# Patient Record
Sex: Male | Born: 1980 | Race: Black or African American | Hispanic: No | Marital: Single | Smoking: Current every day smoker
Health system: Southern US, Community
[De-identification: ages and names within clinical notes are randomized; demographics above are authoritative.]

## PROBLEM LIST (undated history)

## (undated) ENCOUNTER — Emergency Department (HOSPITAL_COMMUNITY): Admission: EM | Payer: Self-pay | Source: Home / Self Care

## (undated) DIAGNOSIS — F32A Depression, unspecified: Secondary | ICD-10-CM

## (undated) DIAGNOSIS — F329 Major depressive disorder, single episode, unspecified: Secondary | ICD-10-CM

## (undated) DIAGNOSIS — F319 Bipolar disorder, unspecified: Secondary | ICD-10-CM

---
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
## 2005-07-04 ENCOUNTER — Emergency Department (HOSPITAL_COMMUNITY): Admission: EM | Admit: 2005-07-04 | Discharge: 2005-07-05 | Payer: Self-pay | Admitting: Emergency Medicine

## 2007-11-13 ENCOUNTER — Emergency Department (HOSPITAL_COMMUNITY): Admission: EM | Admit: 2007-11-13 | Discharge: 2007-11-13 | Payer: Self-pay | Admitting: Emergency Medicine

## 2007-12-10 ENCOUNTER — Emergency Department (HOSPITAL_COMMUNITY): Admission: EM | Admit: 2007-12-10 | Discharge: 2007-12-12 | Payer: Self-pay | Admitting: Emergency Medicine

## 2008-04-05 ENCOUNTER — Emergency Department (HOSPITAL_COMMUNITY): Admission: EM | Admit: 2008-04-05 | Discharge: 2008-04-05 | Payer: Self-pay | Admitting: Emergency Medicine

## 2009-06-10 ENCOUNTER — Emergency Department (HOSPITAL_COMMUNITY): Admission: EM | Admit: 2009-06-10 | Discharge: 2009-06-10 | Payer: Self-pay | Admitting: Emergency Medicine
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
## 2011-01-21 LAB — URINALYSIS, ROUTINE W REFLEX MICROSCOPIC
Bilirubin Urine: NEGATIVE
Ketones, ur: NEGATIVE
Nitrite: NEGATIVE
Protein, ur: NEGATIVE
pH: 6.5

## 2011-01-21 LAB — RAPID URINE DRUG SCREEN, HOSP PERFORMED
Cocaine: NOT DETECTED
Tetrahydrocannabinol: POSITIVE — AB

## 2011-01-21 LAB — SALICYLATE LEVEL: Salicylate Lvl: <4

## 2011-01-21 LAB — DIFFERENTIAL
Basophils Absolute: 0
Basophils Relative: 0
Eosinophils Absolute: 0
Eosinophils Relative: 1
Lymphs Abs: 1.3
Neutrophils Relative %: 73

## 2011-01-21 LAB — GLUCOSE, CAPILLARY: Glucose-Capillary: 76

## 2011-01-21 LAB — CBC
HCT: 46.1
MCHC: 33.4
MCV: 86
Platelets: 157
RDW: 13.7

## 2011-01-21 LAB — POCT I-STAT, CHEM 8
BUN: 6
Hemoglobin: 17
Potassium: 3.7
Sodium: 141
TCO2: 21

## 2011-01-28 LAB — DIFFERENTIAL
Eosinophils Relative: 3 % (ref 0–5)
Lymphocytes Relative: 51 % — ABNORMAL HIGH (ref 12–46)
Lymphs Abs: 3.3 10*3/uL (ref 0.7–4.0)
Monocytes Absolute: 0.6 10*3/uL (ref 0.1–1.0)
Monocytes Relative: 10 % (ref 3–12)

## 2011-01-28 LAB — POCT I-STAT, CHEM 8
BUN: 7 mg/dL (ref 6–23)
Calcium, Ion: 1.17 mmol/L (ref 1.12–1.32)
Creatinine, Ser: 1.1 mg/dL (ref 0.4–1.5)
Glucose, Bld: 95 mg/dL (ref 70–99)
TCO2: 26 mmol/L (ref 0–100)

## 2011-01-28 LAB — URINALYSIS, ROUTINE W REFLEX MICROSCOPIC
Ketones, ur: NEGATIVE mg/dL
Nitrite: NEGATIVE
Protein, ur: NEGATIVE mg/dL
pH: 6 (ref 5.0–8.0)

## 2011-01-28 LAB — CBC
HCT: 45 % (ref 39.0–52.0)
Hemoglobin: 14.8 g/dL (ref 13.0–17.0)
WBC: 6.4 10*3/uL (ref 4.0–10.5)

## 2011-01-28 LAB — RAPID URINE DRUG SCREEN, HOSP PERFORMED: Barbiturates: NOT DETECTED

## 2011-01-28 LAB — ETHANOL: Alcohol, Ethyl (B): <5 mg/dL (ref 0–10)

## 2011-08-19 ENCOUNTER — Emergency Department (HOSPITAL_COMMUNITY): Payer: Medicaid Other

## 2011-08-19 ENCOUNTER — Encounter (HOSPITAL_COMMUNITY): Payer: Self-pay | Admitting: *Deleted

## 2011-08-19 ENCOUNTER — Emergency Department (HOSPITAL_COMMUNITY)
Admission: EM | Admit: 2011-08-19 | Discharge: 2011-08-19 | Disposition: A | Discharge status: Home / Self Care | Payer: Medicaid Other | Attending: Emergency Medicine | Admitting: Emergency Medicine

## 2011-08-19 DIAGNOSIS — S5000XA Contusion of unspecified elbow, initial encounter: Secondary | ICD-10-CM | POA: Insufficient documentation

## 2011-08-19 DIAGNOSIS — IMO0002 Reserved for concepts with insufficient information to code with codable children: Secondary | ICD-10-CM | POA: Insufficient documentation

## 2011-08-19 DIAGNOSIS — F319 Bipolar disorder, unspecified: Secondary | ICD-10-CM | POA: Insufficient documentation

## 2011-08-19 DIAGNOSIS — S60519A Abrasion of unspecified hand, initial encounter: Secondary | ICD-10-CM

## 2011-08-19 DIAGNOSIS — M25429 Effusion, unspecified elbow: Secondary | ICD-10-CM | POA: Insufficient documentation

## 2011-08-19 DIAGNOSIS — M79609 Pain in unspecified limb: Secondary | ICD-10-CM | POA: Insufficient documentation

## 2011-08-19 DIAGNOSIS — Z79899 Other long term (current) drug therapy: Secondary | ICD-10-CM | POA: Insufficient documentation

## 2011-08-19 DIAGNOSIS — M25529 Pain in unspecified elbow: Secondary | ICD-10-CM | POA: Insufficient documentation

## 2011-08-19 HISTORY — DX: Bipolar disorder, unspecified: F31.9

## 2011-08-19 MED ORDER — OXYCODONE-ACETAMINOPHEN 5-325 MG PO TABS: 2.0000 | ORAL_TABLET | ORAL | Status: AC | PRN

## 2011-08-19 MED ORDER — OXYCODONE-ACETAMINOPHEN 5-325 MG PO TABS
2.0000 | ORAL_TABLET | Freq: Once | ORAL | Status: AC
  Administered 2011-08-19: 2 via ORAL
  Filled 2011-08-19: qty 2

## 2011-08-19 NOTE — Discharge Instructions (Signed)
   Wear the sling and use the Ace wrap for comfort,  Clean the wounds of the right hand twice a day with soap and water.  Return here if needed for problems.    Abrasions Abrasions are skin scrapes. Their treatment depends on how large and deep the abrasion is. Abrasions do not extend through all layers of the skin. A cut or lesion through all skin layers is called a laceration. HOME CARE INSTRUCTIONS Contusion A contusion is a deep bruise. Contusions happen when an injury causes bleeding under the skin. Signs of bruising include pain, puffiness (swelling), and discolored skin. The contusion may turn blue, purple, or yellow. HOME CARE   Put ice on the injured area.   Put ice in a plastic bag.   Place a towel between your skin and the bag.   Leave the ice on for 15 to 20 minutes, 3 to 4 times a day.   Only take medicine as told by your doctor.   Rest the injured area.   If possible, raise (elevate) the injured area to lessen puffiness.  GET HELP RIGHT AWAY IF:   You have more bruising or puffiness.   You have pain that is getting worse.   Your puffiness or pain is not helped by medicine.  MAKE SURE YOU:   Understand these instructions.   Will watch your condition.   Will get help right away if you are not doing well or get worse.  Document Released: 09/28/2007 Document Revised: 03/31/2011 Document Reviewed: 02/14/2011 [facility] Patient Information 2012 [place], [place].  If you were given a dressing, change it at least once a day or as instructed by your caregiver. If the bandage sticks, soak it off with a solution of water or hydrogen peroxide.   Twice a day, wash the area with soap and water to remove all the cream/ointment. You may do this in a sink, under a tub faucet, or in a shower. Rinse off the soap and pat dry with a clean towel. Look for signs of infection (see below).   Reapply cream/ointment according to your caregiver's instruction. This will help prevent  infection and keep the bandage from sticking. Telfa or gauze over the wound and under the dressing or wrap will also help keep the bandage from sticking.   If the bandage becomes wet, dirty, or develops a foul smell, change it as soon as possible.   Only take over-the-counter or prescription medicines for pain, discomfort, or fever as directed by your caregiver.  SEEK IMMEDIATE MEDICAL CARE IF:   Increasing pain in the wound.   Signs of infection develop: redness, swelling, surrounding area is tender to touch, or pus coming from the wound.   You have a fever.   Any foul smell coming from the wound or dressing.  Most skin wounds heal within ten days. Facial wounds heal faster. However, an infection may occur despite proper treatment. You should have the wound checked for signs of infection within 24 to 48 hours or sooner if problems arise. If you were not given a wound-check appointment, look closely at the wound yourself on the second day for early signs of infection listed above. MAKE SURE YOU:   Understand these instructions.   Will watch your condition.   Will get help right away if you are not doing well or get worse.  Document Released: 01/19/2005 Document Revised: 03/31/2011 Document Reviewed: 03/15/2011 [facility] Patient Information 2012 [place], [place].

## 2011-08-19 NOTE — Progress Notes (Signed)
Orthopedic Tech Progress Note Patient Details:  Jack Castaneda [birth year] [id number]  Patient ID: Jack Castaneda, male   DOB: 02-15-1981, 31 y.o.   MRN: [id number]   [name] 08/19/2011, 2:09 PM
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
## 2011-08-19 NOTE — ED Notes (Signed)
Ortho paged. 

## 2011-08-19 NOTE — ED Notes (Signed)
Right hand washed in warm water with soap, area dried, no need for sutures or dressing
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
## 2011-08-19 NOTE — Progress Notes (Signed)
Orthopedic Tech Progress Note Patient Details:  Jack Castaneda [birth year] [id number]  Other Ortho Devices Type of Ortho Device: Other (comment) (arm sling) Ortho Device Location: (L) UE Ortho Device Interventions: Application   [name] 08/19/2011, 2:08 PM

## 2011-11-29 ENCOUNTER — Emergency Department (HOSPITAL_COMMUNITY)
Admission: EM | Admit: 2011-11-29 | Discharge: 2011-11-30 | Disposition: A | Discharge status: Home Health | Payer: Medicaid Other | Attending: Emergency Medicine | Admitting: Emergency Medicine

## 2011-11-29 ENCOUNTER — Encounter (HOSPITAL_COMMUNITY): Payer: Self-pay

## 2011-11-29 DIAGNOSIS — F319 Bipolar disorder, unspecified: Secondary | ICD-10-CM | POA: Insufficient documentation

## 2011-11-29 DIAGNOSIS — L03119 Cellulitis of unspecified part of limb: Secondary | ICD-10-CM | POA: Insufficient documentation

## 2011-11-29 DIAGNOSIS — F17200 Nicotine dependence, unspecified, uncomplicated: Secondary | ICD-10-CM | POA: Insufficient documentation

## 2011-11-29 DIAGNOSIS — L0390 Cellulitis, unspecified: Secondary | ICD-10-CM

## 2011-11-29 DIAGNOSIS — T148XXA Other injury of unspecified body region, initial encounter: Secondary | ICD-10-CM

## 2011-11-29 DIAGNOSIS — L02419 Cutaneous abscess of limb, unspecified: Secondary | ICD-10-CM | POA: Insufficient documentation

## 2011-11-29 NOTE — ED Notes (Signed)
Pt presents with no acute distress.  Moving furniture and hit LLE causing scratch - wound slightly red with no discharge.  Pt requesting antibiotics

## 2011-11-29 NOTE — ED Notes (Addendum)
Pt wants the dr wants to take a look at the left leg bc a piece of furniture landed on the left leg. Pt pour peroxide on the left lower leg.  Pt sts that an bump appear on the (R) buttocks after pouring peroxide on the (L) leg

## 2011-11-30 MED ORDER — HYDROCODONE-ACETAMINOPHEN 5-325 MG PO TABS: 1.0000 | ORAL_TABLET | Freq: Four times a day (QID) | ORAL | Status: AC | PRN

## 2011-11-30 MED ORDER — CLINDAMYCIN HCL 150 MG PO CAPS: 450.0000 mg | ORAL_CAPSULE | Freq: Three times a day (TID) | ORAL | Status: AC

## 2011-11-30 MED ORDER — TETANUS-DIPHTH-ACELL PERTUSSIS 5-2.5-18.5 LF-MCG/0.5 IM SUSP
0.5000 mL | Freq: Once | INTRAMUSCULAR | Status: AC
  Administered 2011-11-30: 0.5 mL via INTRAMUSCULAR
  Filled 2011-11-30: qty 0.5

## 2011-11-30 NOTE — ED Provider Notes (Signed)
Medical screening examination/treatment/procedure(s) were performed by non-physician practitioner and as supervising physician I was immediately available for consultation/collaboration.   Amanee Iacovelli L Liona Wengert, MD 11/30/11 0739 

## 2011-11-30 NOTE — ED Provider Notes (Signed)
History     CSN: [id number]  Arrival date & time 11/29/11  1900   First MD Initiated Contact with Patient 11/29/11 2323      Chief Complaint  Patient presents with  . Wound Check    (Consider location/radiation/quality/duration/timing/severity/associated sxs/prior treatment) HPI Comments: Patient presents emergency department with scratch to abrasions to left lower extremity that occurred yesterday.  Patient states that he is concerned that it is infected because the pain has increased and the surrounding skin has become erythematous.  Mechanism of injury was scraping against a piece of furniture.  Patient states that his tetanus status is not up-to-date.  In addition he complains of a bump on his right buttock that he thinks may be an abscess.  Patient denies fevers, night sweats, or chills.  No other complaints at this time.  Patient is a 31 y.o. male presenting with wound check. The history is provided by the patient.  Wound Check     Past Medical History  Diagnosis Date  . Bipolar 1 disorder     History reviewed. No pertinent past surgical history.  No family history on file.  History  Substance Use Topics  . Smoking status: Current Everyday Smoker    Types: Cigarettes  . Smokeless tobacco: Not on file  . Alcohol Use: Yes      Review of Systems  All other systems reviewed and are negative.    Allergies  Review of patient's allergies indicates no known allergies.  Home Medications   Current Outpatient Rx  Name Route Sig Dispense Refill  . ARIPIPRAZOLE 2 MG PO TABS Oral Take 2 mg by mouth daily.    Marland Kitchen HYDROXYZINE PAMOATE 25 MG PO CAPS Oral Take 25 mg by mouth 3 (three) times daily as needed. For anxiety      BP 131/76  Pulse 54  Temp 98.1 F (36.7 C) (Oral)  Resp 18  Ht 5\' 5"  (1.651 m)  Wt 127 lb (57.607 kg)  BMI 21.13 kg/m2  SpO2 100%  Physical Exam  Nursing note and vitals reviewed. Constitutional: He is oriented to person, place, and time. He  appears well-developed and well-nourished. He does not have a sickly appearance. He does not appear ill. No distress.  HENT:  Head: Normocephalic and atraumatic.  Eyes: Conjunctivae and EOM are normal.  Neck: Normal range of motion. Neck supple.  Cardiovascular: Normal rate and regular rhythm.   Pulmonary/Chest: Effort normal and breath sounds normal.  Musculoskeletal: He exhibits no edema.  Lymphadenopathy:       Head (right side): No submental, no preauricular and no posterior auricular adenopathy present.       Head (left side): No submental, no submandibular, no preauricular and no posterior auricular adenopathy present.    He has no axillary adenopathy.  Neurological: He is alert and oriented to person, place, and time.  Skin: Skin is warm and dry. No rash noted. He is not diaphoretic.       Small possible early abscess on right buttock that is not fluctuant and does not have any surrounding erythema.  Moderate tenderness to palpation. Not currently draining.  Patient with abrasions on anterior left lower extremity with area of surrounding erythema approximately 1 cm.  Mild warmth.  No drainage.    ED Course  Procedures (including critical care time)  Labs Reviewed - No data to display No results found.   No diagnosis found.    MDM  Mild cellulitis of anterior lower extremity, questionable early abscess on  right buttock  Patient will be discharged on antibiotics and instructions to followup in regards to abscess and mild cellulitis.  Return precautions discussed including fever, surrounding erythema spreading, erythematous streaking, etc.  Patient verbalizes understanding.        Jaci Carrel, [place] 11/30/11 [phone]

## 2011-12-26 ENCOUNTER — Emergency Department (HOSPITAL_COMMUNITY)
Admission: EM | Admit: 2011-12-26 | Discharge: 2011-12-26 | Disposition: A | Discharge status: Home / Self Care | Payer: Medicaid Other | Attending: Emergency Medicine | Admitting: Emergency Medicine

## 2011-12-26 ENCOUNTER — Encounter (HOSPITAL_COMMUNITY): Payer: Self-pay | Admitting: *Deleted

## 2011-12-26 DIAGNOSIS — F319 Bipolar disorder, unspecified: Secondary | ICD-10-CM | POA: Insufficient documentation

## 2011-12-26 DIAGNOSIS — T6391XA Toxic effect of contact with unspecified venomous animal, accidental (unintentional), initial encounter: Secondary | ICD-10-CM | POA: Insufficient documentation

## 2011-12-26 DIAGNOSIS — F17200 Nicotine dependence, unspecified, uncomplicated: Secondary | ICD-10-CM | POA: Insufficient documentation

## 2011-12-26 DIAGNOSIS — T63461A Toxic effect of venom of wasps, accidental (unintentional), initial encounter: Secondary | ICD-10-CM | POA: Insufficient documentation

## 2011-12-26 DIAGNOSIS — T63441A Toxic effect of venom of bees, accidental (unintentional), initial encounter: Secondary | ICD-10-CM

## 2011-12-26 MED ORDER — DIPHENHYDRAMINE HCL 25 MG PO TABS: 25.0000 mg | ORAL_TABLET | Freq: Three times a day (TID) | ORAL | Status: DC | PRN

## 2011-12-26 MED ORDER — LORAZEPAM 1 MG PO TABS
1.0000 mg | ORAL_TABLET | Freq: Once | ORAL | Status: AC
  Administered 2011-12-26: 1 mg via ORAL
  Filled 2011-12-26: qty 1

## 2011-12-26 MED ORDER — PREDNISONE 20 MG PO TABS
10.0000 mg | ORAL_TABLET | Freq: Once | ORAL | Status: AC
  Administered 2011-12-26: 10 mg via ORAL
  Filled 2011-12-26: qty 1

## 2011-12-26 MED ORDER — IBUPROFEN 800 MG PO TABS: 800.0000 mg | ORAL_TABLET | Freq: Three times a day (TID) | ORAL | Status: AC

## 2011-12-26 MED ORDER — PREDNISOLONE 5 MG PO TABS
10.0000 mg | ORAL_TABLET | Freq: Once | ORAL | Status: DC
  Filled 2011-12-26: qty 2

## 2011-12-26 MED ORDER — OXYCODONE-ACETAMINOPHEN 5-325 MG PO TABS
1.0000 | ORAL_TABLET | Freq: Once | ORAL | Status: AC
  Administered 2011-12-26: 1 via ORAL
  Filled 2011-12-26: qty 1

## 2011-12-26 MED ORDER — PREDNISONE 50 MG PO TABS: ORAL_TABLET | ORAL | Status: AC

## 2011-12-26 MED ORDER — DIPHENHYDRAMINE HCL 50 MG/ML IJ SOLN
50.0000 mg | Freq: Once | INTRAMUSCULAR | Status: AC
  Administered 2011-12-26: 50 mg via INTRAMUSCULAR
  Filled 2011-12-26: qty 1

## 2011-12-26 MED ORDER — FAMOTIDINE 20 MG PO TABS: 10.0000 mg | ORAL_TABLET | Freq: Two times a day (BID) | ORAL | Status: DC

## 2011-12-26 NOTE — ED Notes (Signed)
Stung by ? Yellow jacket PTA, to lower rt leg, redness noted

## 2011-12-26 NOTE — ED Provider Notes (Signed)
History     CSN: [id number]  Arrival date & time 12/26/11  1111   First MD Initiated Contact with Patient 12/26/11 1151      Chief Complaint  Patient presents with  . Insect Bite    (Consider location/radiation/quality/duration/timing/severity/associated sxs/prior treatment) HPI Comments: Jack Castaneda 31 y.o. male   The chief complaint is: Patient presents with:   Insect Bite   The patient has medical history significant for:   Past Medical History:   Bipolar 1 disorder                                          Patient presents after two bee stings to his right lower leg. He states that his leg is very painful and was swollen prior to presentation. Denies fever or chills. Denies CP, SOB, palpitations, or dysphagia. Denies NVD or abdominal pain.      The history is provided by the patient.    Past Medical History  Diagnosis Date  . Bipolar 1 disorder     History reviewed. No pertinent past surgical history.  No family history on file.  History  Substance Use Topics  . Smoking status: Current Everyday Smoker    Types: Cigarettes  . Smokeless tobacco: Not on file  . Alcohol Use: Yes      Review of Systems  Constitutional: Negative for fever and chills.  HENT: Negative for trouble swallowing.   Respiratory: Negative for cough and shortness of breath.   Gastrointestinal: Negative for nausea, vomiting, abdominal pain and diarrhea.  Skin: Positive for color change.  All other systems reviewed and are negative.    Allergies  Review of patient's allergies indicates no known allergies.  Home Medications   Current Outpatient Rx  Name Route Sig Dispense Refill  . ARIPIPRAZOLE 2 MG PO TABS Oral Take 2 mg by mouth daily.    Marland Kitchen HYDROXYZINE PAMOATE 25 MG PO CAPS Oral Take 25 mg by mouth 3 (three) times daily as needed. For anxiety      BP 109/63  Temp 98.3 F (36.8 C) (Oral)  Resp 18  Ht 5\' 5"  (1.651 m)  Wt 135 lb (61.236 kg)  BMI 22.47 kg/m2  SpO2  100%  Physical Exam  Nursing note and vitals reviewed. Constitutional: He appears well-developed and well-nourished. He appears distressed.  HENT:  Head: Normocephalic and atraumatic.  Mouth/Throat: Oropharynx is clear and moist.       Oropharynx without swelling or edema.  Eyes: Conjunctivae and EOM are normal. No scleral icterus.  Neck: Normal range of motion. Neck supple.  Cardiovascular: Normal rate, regular rhythm and normal heart sounds.   Pulmonary/Chest: Effort normal and breath sounds normal.  Abdominal: Soft. Bowel sounds are normal. There is no tenderness.  Neurological: He is alert.  Skin: Skin is warm and dry.       Right lower legs show two erythematous areas indicative of insect bites. Areas are tender to palpation.  Patient reports swelling, but not appreciated on exam.    ED Course  Procedures (including critical care time)  Labs Reviewed - No data to display No results found.   1. Bee sting       MDM  Patient presented s/p two bee stings. Patient complains of extreme pain and swelling. Patient given prednisone, benadryl, ativan and ibuprofen in ED. Patient reassessed and feels better, and ready for discharge. Patient  discharge on benadryl, Pepcid, ibuprofen and 5 day course of prednisone. Return precautions given. No red flags for anaphylaxis.        Pixie Casino, PA-C 12/26/11 1340

## 2011-12-29 NOTE — ED Provider Notes (Signed)
Medical screening examination/treatment/procedure(s) were performed by non-physician practitioner and as supervising physician I was immediately available for consultation/collaboration.   Laray Anger, DO 12/29/11 [phone]

## 2012-03-03 ENCOUNTER — Emergency Department (HOSPITAL_COMMUNITY)
Admission: EM | Admit: 2012-03-03 | Discharge: 2012-03-04 | Disposition: A | Discharge status: Home / Self Care | Payer: Medicaid Other | Attending: Emergency Medicine | Admitting: Emergency Medicine

## 2012-03-03 ENCOUNTER — Encounter (HOSPITAL_COMMUNITY): Payer: Self-pay | Admitting: *Deleted

## 2012-03-03 DIAGNOSIS — R45851 Suicidal ideations: Secondary | ICD-10-CM | POA: Insufficient documentation

## 2012-03-03 DIAGNOSIS — F329 Major depressive disorder, single episode, unspecified: Secondary | ICD-10-CM

## 2012-03-03 DIAGNOSIS — F3181 Bipolar II disorder: Secondary | ICD-10-CM

## 2012-03-03 DIAGNOSIS — Z79899 Other long term (current) drug therapy: Secondary | ICD-10-CM | POA: Insufficient documentation

## 2012-03-03 DIAGNOSIS — F3289 Other specified depressive episodes: Secondary | ICD-10-CM | POA: Insufficient documentation

## 2012-03-03 DIAGNOSIS — F3189 Other bipolar disorder: Secondary | ICD-10-CM | POA: Insufficient documentation

## 2012-03-03 DIAGNOSIS — F17200 Nicotine dependence, unspecified, uncomplicated: Secondary | ICD-10-CM | POA: Insufficient documentation

## 2012-03-03 LAB — CBC WITH DIFFERENTIAL/PLATELET
Hemoglobin: 16 g/dL (ref 13.0–17.0)
Lymphocytes Relative: 38 % (ref 12–46)
Lymphs Abs: 2.1 10*3/uL (ref 0.7–4.0)
MCH: 29.7 pg (ref 26.0–34.0)
Monocytes Relative: 7 % (ref 3–12)
Neutro Abs: 3 10*3/uL (ref 1.7–7.7)
Neutrophils Relative %: 53 % (ref 43–77)
Platelets: 198 10*3/uL (ref 150–400)
RBC: 5.38 MIL/uL (ref 4.22–5.81)
WBC: 5.7 10*3/uL (ref 4.0–10.5)

## 2012-03-03 LAB — COMPREHENSIVE METABOLIC PANEL
ALT: 25 U/L (ref 0–53)
Alkaline Phosphatase: 92 U/L (ref 39–117)
BUN: 7 mg/dL (ref 6–23)
CO2: 27 mEq/L (ref 19–32)
Chloride: 100 mEq/L (ref 96–112)
GFR calc Af Amer: >90 mL/min (ref >90)
Glucose, Bld: 93 mg/dL (ref 70–99)
Potassium: 3.5 mEq/L (ref 3.5–5.1)
Sodium: 137 mEq/L (ref 135–145)
Total Bilirubin: 0.5 mg/dL (ref 0.3–1.2)
Total Protein: 7.3 g/dL (ref 6.0–8.3)

## 2012-03-03 LAB — ETHANOL: Alcohol, Ethyl (B): 66 mg/dL — ABNORMAL HIGH (ref 0–11)

## 2012-03-03 LAB — URINALYSIS, ROUTINE W REFLEX MICROSCOPIC
Hgb urine dipstick: NEGATIVE
Ketones, ur: NEGATIVE mg/dL
Protein, ur: NEGATIVE mg/dL
Urobilinogen, UA: 0.2 mg/dL (ref 0.0–1.0)

## 2012-03-03 LAB — RAPID URINE DRUG SCREEN, HOSP PERFORMED
Amphetamines: NOT DETECTED
Barbiturates: NOT DETECTED
Opiates: NOT DETECTED
Tetrahydrocannabinol: POSITIVE — AB

## 2012-03-03 MED ORDER — IBUPROFEN 200 MG PO TABS: 600.0000 mg | ORAL_TABLET | Freq: Three times a day (TID) | ORAL | Status: DC | PRN

## 2012-03-03 MED ORDER — ZOLPIDEM TARTRATE 5 MG PO TABS: 5.0000 mg | ORAL_TABLET | Freq: Every evening | ORAL | Status: DC | PRN

## 2012-03-03 MED ORDER — LORAZEPAM 1 MG PO TABS: 1.0000 mg | ORAL_TABLET | Freq: Three times a day (TID) | ORAL | Status: DC | PRN

## 2012-03-03 MED ORDER — ONDANSETRON HCL 8 MG PO TABS: 4.0000 mg | ORAL_TABLET | Freq: Three times a day (TID) | ORAL | Status: DC | PRN

## 2012-03-03 MED ORDER — NICOTINE 21 MG/24HR TD PT24
21.0000 mg | MEDICATED_PATCH | Freq: Every day | TRANSDERMAL | Status: DC
  Administered 2012-03-04: 21 mg via TRANSDERMAL
  Filled 2012-03-03: qty 1
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
## 2012-03-03 NOTE — ED Provider Notes (Signed)
Medical screening examination/treatment/procedure(s) were performed by non-physician practitioner and as supervising physician I was immediately available for consultation/collaboration.   Dione Booze, MD 03/03/12 2233
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
## 2012-03-03 NOTE — ED Notes (Signed)
The chg rn and the a-c has been notified that he needs a sitter  For si

## 2012-03-04 ENCOUNTER — Encounter (HOSPITAL_COMMUNITY): Payer: Self-pay | Admitting: *Deleted

## 2012-03-04 NOTE — ED Notes (Signed)
Pt. Ate 90% of breakfast. Teary eyed when talking with pt.

## 2012-03-04 NOTE — ED Provider Notes (Signed)
On morning rounds the patient is speaking clearly, and in no distress.  He continues to have perseverant, racing thoughts.  VSS  Per RN no overnight events / complaints  Gerhard Munch, MD 03/04/12 [phone]
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
## 2012-03-04 NOTE — ED Notes (Signed)
Telepsych being performed at the bedside. 

## 2012-03-04 NOTE — ED Notes (Signed)
Brother of pt Casimiro Needle) phone # if needed--[phone].

## 2012-03-04 NOTE — ED Notes (Signed)
Pt resting at this time.  Remains calm and cooperative.  Sitter at bedside.

## 2012-03-04 NOTE — ED Notes (Signed)
PT speaking with Telepsych at this time.

## 2012-03-04 NOTE — ED Notes (Signed)
Nicotine patch ordered for pt.  Pt states does not want to have patch.

## 2012-03-04 NOTE — ED Notes (Signed)
Visiter at bedside. Pt calm and cooperative. Denies SI at this time.

## 2012-03-04 NOTE — ED Provider Notes (Signed)
Pt seen by Telepsych and cleared for discharge to outpatient treatment. No longer suicidal .   Leonette Most B. Bernette Mayers, MD 03/04/12 2158

## 2014-11-01 ENCOUNTER — Emergency Department (HOSPITAL_COMMUNITY): Payer: Medicaid Other

## 2014-11-01 ENCOUNTER — Emergency Department (HOSPITAL_COMMUNITY)
Admission: EM | Admit: 2014-11-01 | Discharge: 2014-11-01 | Disposition: A | Discharge status: Home / Self Care | Payer: Medicaid Other | Attending: Emergency Medicine | Admitting: Emergency Medicine

## 2014-11-01 ENCOUNTER — Encounter (HOSPITAL_COMMUNITY): Payer: Self-pay | Admitting: *Deleted

## 2014-11-01 DIAGNOSIS — Y998 Other external cause status: Secondary | ICD-10-CM | POA: Diagnosis not present

## 2014-11-01 DIAGNOSIS — IMO0002 Reserved for concepts with insufficient information to code with codable children: Secondary | ICD-10-CM

## 2014-11-01 DIAGNOSIS — S40011A Contusion of right shoulder, initial encounter: Secondary | ICD-10-CM | POA: Diagnosis not present

## 2014-11-01 DIAGNOSIS — S40212A Abrasion of left shoulder, initial encounter: Secondary | ICD-10-CM | POA: Diagnosis not present

## 2014-11-01 DIAGNOSIS — S70211A Abrasion, right hip, initial encounter: Secondary | ICD-10-CM | POA: Insufficient documentation

## 2014-11-01 DIAGNOSIS — T07XXXA Unspecified multiple injuries, initial encounter: Secondary | ICD-10-CM

## 2014-11-01 DIAGNOSIS — F319 Bipolar disorder, unspecified: Secondary | ICD-10-CM | POA: Insufficient documentation

## 2014-11-01 DIAGNOSIS — Y92410 Unspecified street and highway as the place of occurrence of the external cause: Secondary | ICD-10-CM | POA: Insufficient documentation

## 2014-11-01 DIAGNOSIS — S50311A Abrasion of right elbow, initial encounter: Secondary | ICD-10-CM | POA: Insufficient documentation

## 2014-11-01 DIAGNOSIS — S5002XA Contusion of left elbow, initial encounter: Secondary | ICD-10-CM | POA: Diagnosis not present

## 2014-11-01 DIAGNOSIS — Z79899 Other long term (current) drug therapy: Secondary | ICD-10-CM | POA: Insufficient documentation

## 2014-11-01 DIAGNOSIS — Z720 Tobacco use: Secondary | ICD-10-CM | POA: Insufficient documentation

## 2014-11-01 DIAGNOSIS — Y9389 Activity, other specified: Secondary | ICD-10-CM | POA: Diagnosis not present

## 2014-11-01 DIAGNOSIS — S199XXA Unspecified injury of neck, initial encounter: Secondary | ICD-10-CM | POA: Insufficient documentation

## 2014-11-01 DIAGNOSIS — S40211A Abrasion of right shoulder, initial encounter: Secondary | ICD-10-CM | POA: Insufficient documentation

## 2014-11-01 DIAGNOSIS — M542 Cervicalgia: Secondary | ICD-10-CM

## 2014-11-01 DIAGNOSIS — S4991XA Unspecified injury of right shoulder and upper arm, initial encounter: Secondary | ICD-10-CM | POA: Diagnosis present
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
## 2016-06-07 ENCOUNTER — Encounter (HOSPITAL_COMMUNITY): Payer: Self-pay

## 2016-06-07 ENCOUNTER — Emergency Department (HOSPITAL_COMMUNITY)
Admission: EM | Admit: 2016-06-07 | Discharge: 2016-06-07 | Disposition: A | Discharge status: Home / Self Care | Payer: Medicaid Other | Attending: Emergency Medicine | Admitting: Emergency Medicine

## 2016-06-07 DIAGNOSIS — K047 Periapical abscess without sinus: Secondary | ICD-10-CM | POA: Insufficient documentation

## 2016-06-07 DIAGNOSIS — F17210 Nicotine dependence, cigarettes, uncomplicated: Secondary | ICD-10-CM | POA: Insufficient documentation

## 2016-06-07 MED ORDER — AMOXICILLIN 500 MG PO CAPS
1000.0000 mg | ORAL_CAPSULE | Freq: Once | ORAL | Status: AC
  Administered 2016-06-07: 1000 mg via ORAL
  Filled 2016-06-07: qty 2

## 2016-06-07 MED ORDER — AMOXICILLIN 500 MG PO CAPS: 500.0000 mg | ORAL_CAPSULE | Freq: Three times a day (TID) | ORAL | 0 refills | Status: DC

## 2016-06-07 MED ORDER — IBUPROFEN 200 MG PO TABS
400.0000 mg | ORAL_TABLET | Freq: Once | ORAL | Status: AC
  Administered 2016-06-07: 400 mg via ORAL
  Filled 2016-06-07: qty 2

## 2016-06-07 NOTE — ED Provider Notes (Signed)
[facility]-EMERGENCY DEPT Provider Note   CSN: [id number] Arrival date & time: 06/07/16  4098     History   Chief Complaint Chief Complaint  Patient presents with  . Facial Swelling    HPI Jack Castaneda is a 36 y.o. male.  Patient c/o left lower tooth/jaw pain and swelling in the past few days. Symptoms persistent/constant, moderate. Patient indicates tooth has been broken off for 1+years, but swelling and increase in pain is new. No fever or chills. No neck swelling. No trouble breathing or swallowing. Has no local dentist.    The history is provided by the patient.    Past Medical History:  Diagnosis Date  . Bipolar 1 disorder (HCC)     There are no active problems to display for this patient.   History reviewed. No pertinent surgical history.     Home Medications    Prior to Admission medications   Medication Sig Start Date End Date Taking? Authorizing Provider  ARIPiprazole (ABILIFY) 20 MG tablet Take 20 mg by mouth daily.    Historical Provider, MD  hydrOXYzine (VISTARIL) 25 MG capsule Take 25 mg by mouth 3 (three) times daily as needed. For anxiety    Historical Provider, MD    Family History No family history on file.  Social History Social History  Substance Use Topics  . Smoking status: Current Every Day Smoker    Packs/day: 1.00    Types: Cigarettes  . Smokeless tobacco: Never Used  . Alcohol use Yes     Comment: drinks 1/2 pint liquor once per week     Allergies   Patient has no known allergies.   Review of Systems Review of Systems  Constitutional: Negative for chills and fever.  HENT: Negative for sore throat, trouble swallowing and voice change.   Respiratory: Negative for shortness of breath.   Musculoskeletal: Negative for neck pain.     Physical Exam Updated Vital Signs BP 129/85 (BP Location: Left Arm)   Pulse 60   Temp 98 F (36.7 C) (Oral)   Resp 18   Ht 5\' 6"  (1.676 m)   Wt 57.2 kg   SpO2 100%   BMI 20.34 kg/m    Physical Exam  Constitutional: He appears well-developed and well-nourished. No distress.  HENT:  Mouth/Throat: Oropharynx is clear and moist.  Left lower teeth x 2 broken off just above gumline, one of which appears to have associated dental abscess. No trismus. No swelling to floor of mouth or neck. Pharynx normal.   Eyes: Conjunctivae are normal.  Neck: Neck supple. No tracheal deviation present.  Cardiovascular: Normal rate.   Pulmonary/Chest: Effort normal. No accessory muscle usage. No respiratory distress.  Abdominal: He exhibits distension.  Musculoskeletal: He exhibits no edema.  Neurological: He is alert.  Skin: Skin is warm and dry. He is not diaphoretic.  Psychiatric: He has a normal mood and affect.  Nursing note and vitals reviewed.    ED Treatments / Results  Labs (all labs ordered are listed, but only abnormal results are displayed) Labs Reviewed - No data to display  EKG  EKG Interpretation None       Radiology No results found.  Procedures Procedures (including critical care time)  Medications Ordered in ED Medications  amoxicillin (AMOXIL) capsule 1,000 mg (not administered)  ibuprofen (ADVIL,MOTRIN) tablet 400 mg (not administered)     Initial Impression / Assessment and Plan / ED Course  I have reviewed the triage vital signs and the nursing notes.  Pertinent labs & imaging results that were available during my care of the patient were reviewed by me and considered in my medical decision making (see chart for details).  Confirmed nkda w pt.  Amoxicillin po. Motrin po.  Discussed with patient that he needs to follow up closely with dentist, outpt dental extraction.     Final Clinical Impressions(s) / ED Diagnoses   Final diagnoses:  None    New Prescriptions New Prescriptions   No medications on file     Cathren LaineKevin Viva Gallaher, MD 06/07/16 1007

## 2016-06-07 NOTE — ED Triage Notes (Signed)
Pt presents with c/o facial swelling along his left jaw that started 2 days ago. Pt does have notable swelling to that area, believes he has an infection in one of his teeth. Pt reports he has been unable to sleep.

## 2016-06-07 NOTE — Discharge Instructions (Signed)
It was our pleasure to provide your ER care today - we hope that you feel better.  Take antibiotic (amoxicillin) as prescribed.  Take tylenol and/or motrin as need for pain.  You must follow up with dentist in the next 1-2 days - see resource guide provided - call today to arrange appointment.   Return to ER if worse, trouble breathing or swallowing, severe swelling, intractable pain, other concern.

## 2016-10-09 ENCOUNTER — Emergency Department (HOSPITAL_COMMUNITY)
Admission: EM | Admit: 2016-10-09 | Discharge: 2016-10-09 | Disposition: A | Discharge status: Home / Self Care | Payer: No Typology Code available for payment source | Attending: Emergency Medicine | Admitting: Emergency Medicine

## 2016-10-09 ENCOUNTER — Encounter (HOSPITAL_COMMUNITY): Payer: Self-pay

## 2016-10-09 DIAGNOSIS — R55 Syncope and collapse: Secondary | ICD-10-CM | POA: Diagnosis not present

## 2016-10-09 DIAGNOSIS — F17210 Nicotine dependence, cigarettes, uncomplicated: Secondary | ICD-10-CM | POA: Insufficient documentation

## 2016-10-09 DIAGNOSIS — Y92410 Unspecified street and highway as the place of occurrence of the external cause: Secondary | ICD-10-CM | POA: Insufficient documentation

## 2016-10-09 DIAGNOSIS — Z79899 Other long term (current) drug therapy: Secondary | ICD-10-CM | POA: Insufficient documentation

## 2016-10-09 DIAGNOSIS — S1091XA Abrasion of unspecified part of neck, initial encounter: Secondary | ICD-10-CM | POA: Insufficient documentation

## 2016-10-09 DIAGNOSIS — Y999 Unspecified external cause status: Secondary | ICD-10-CM | POA: Insufficient documentation

## 2016-10-09 DIAGNOSIS — S0181XA Laceration without foreign body of other part of head, initial encounter: Secondary | ICD-10-CM | POA: Insufficient documentation

## 2016-10-09 DIAGNOSIS — Y939 Activity, unspecified: Secondary | ICD-10-CM | POA: Insufficient documentation

## 2016-10-09 DIAGNOSIS — T148XXA Other injury of unspecified body region, initial encounter: Secondary | ICD-10-CM

## 2016-10-09 DIAGNOSIS — S0081XA Abrasion of other part of head, initial encounter: Secondary | ICD-10-CM | POA: Diagnosis present

## 2016-10-09 LAB — CBC WITH DIFFERENTIAL/PLATELET
BASOS ABS: 0 10*3/uL (ref 0.0–0.1)
BASOS PCT: 1 %
EOS ABS: 0.1 10*3/uL (ref 0.0–0.7)
EOS PCT: 2 %
HCT: 45.4 % (ref 39.0–52.0)
Hemoglobin: 15.7 g/dL (ref 13.0–17.0)
LYMPHS PCT: 45 %
Lymphs Abs: 1.9 10*3/uL (ref 0.7–4.0)
MCH: 29.1 pg (ref 26.0–34.0)
MCHC: 34.6 g/dL (ref 30.0–36.0)
MCV: 84.2 fL (ref 78.0–100.0)
MONO ABS: 0.5 10*3/uL (ref 0.1–1.0)
Monocytes Relative: 12 %
Neutro Abs: 1.7 10*3/uL (ref 1.7–7.7)
Neutrophils Relative %: 40 %
Platelets: 164 10*3/uL (ref 150–400)
RBC: 5.39 MIL/uL (ref 4.22–5.81)
RDW: 13.8 % (ref 11.5–15.5)
WBC: 4.1 10*3/uL (ref 4.0–10.5)

## 2016-10-09 LAB — ETHANOL: ALCOHOL ETHYL (B): 16 mg/dL — AB (ref <5)

## 2016-10-09 LAB — COMPREHENSIVE METABOLIC PANEL
ALT: 25 U/L (ref 17–63)
AST: 30 U/L (ref 15–41)
Albumin: 3.5 g/dL (ref 3.5–5.0)
Alkaline Phosphatase: 57 U/L (ref 38–126)
Anion gap: 8 (ref 5–15)
BUN: 14 mg/dL (ref 6–20)
CO2: 26 mmol/L (ref 22–32)
Calcium: 8 mg/dL — ABNORMAL LOW (ref 8.9–10.3)
Chloride: 105 mmol/L (ref 101–111)
Creatinine, Ser: 1.37 mg/dL — ABNORMAL HIGH (ref 0.61–1.24)
GFR calc Af Amer: >60 mL/min (ref >60)
Glucose, Bld: 108 mg/dL — ABNORMAL HIGH (ref 65–99)
Potassium: 3.4 mmol/L — ABNORMAL LOW (ref 3.5–5.1)
SODIUM: 139 mmol/L (ref 135–145)
Total Bilirubin: 1.1 mg/dL (ref 0.3–1.2)
Total Protein: 5.7 g/dL — ABNORMAL LOW (ref 6.5–8.1)

## 2016-10-09 MED ORDER — HYDROXYZINE HCL 25 MG PO TABS: 25.0000 mg | ORAL_TABLET | Freq: Three times a day (TID) | ORAL | 0 refills | Status: DC | PRN

## 2016-10-09 MED ORDER — SODIUM CHLORIDE 0.9 % IV BOLUS (SEPSIS)
1000.0000 mL | Freq: Once | INTRAVENOUS | Status: AC
  Administered 2016-10-09: 1000 mL via INTRAVENOUS

## 2016-10-09 MED ORDER — ARIPIPRAZOLE 20 MG PO TABS: 20.0000 mg | ORAL_TABLET | Freq: Every day | ORAL | 0 refills | Status: DC
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
## 2016-10-09 NOTE — Discharge Instructions (Signed)
Stay hydrated.   Stay out of the heat. Avoid drinking alcohol.   Take abilify and hydroxyzine as prescribed.   Call medicaid office to inquire about your medicaid status tomorrow.   See your doctor  Return to ER if you have worse dizziness, passing out, headaches, vomiting, abdominal pain, chest pain

## 2016-10-09 NOTE — ED Notes (Signed)
Bed: [id number] Expected date:  Expected time:  Means of arrival:  Comments: EMS/heat exhaustion/hypotensive

## 2017-10-20 ENCOUNTER — Encounter (HOSPITAL_COMMUNITY): Payer: Self-pay | Admitting: Emergency Medicine

## 2017-10-20 ENCOUNTER — Emergency Department (HOSPITAL_COMMUNITY)
Admission: EM | Admit: 2017-10-20 | Discharge: 2017-10-20 | Disposition: A | Discharge status: Home / Self Care | Payer: Self-pay | Attending: Emergency Medicine | Admitting: Emergency Medicine

## 2017-10-20 ENCOUNTER — Other Ambulatory Visit: Payer: Self-pay

## 2017-10-20 DIAGNOSIS — Y929 Unspecified place or not applicable: Secondary | ICD-10-CM | POA: Insufficient documentation

## 2017-10-20 DIAGNOSIS — Y999 Unspecified external cause status: Secondary | ICD-10-CM | POA: Insufficient documentation

## 2017-10-20 DIAGNOSIS — F17210 Nicotine dependence, cigarettes, uncomplicated: Secondary | ICD-10-CM | POA: Insufficient documentation

## 2017-10-20 DIAGNOSIS — W19XXXA Unspecified fall, initial encounter: Secondary | ICD-10-CM | POA: Insufficient documentation

## 2017-10-20 DIAGNOSIS — S80211A Abrasion, right knee, initial encounter: Secondary | ICD-10-CM | POA: Insufficient documentation

## 2017-10-20 DIAGNOSIS — Z79899 Other long term (current) drug therapy: Secondary | ICD-10-CM | POA: Insufficient documentation

## 2017-10-20 DIAGNOSIS — Y939 Activity, unspecified: Secondary | ICD-10-CM | POA: Insufficient documentation

## 2017-10-20 DIAGNOSIS — T148XXA Other injury of unspecified body region, initial encounter: Secondary | ICD-10-CM

## 2017-10-20 NOTE — ED Triage Notes (Signed)
Pt arrived with c/o multiple abrasions to the right knee, and left abdomen. Pt in manic state, escorted by security and [employer] from waiting room.  EDP @ bedside.

## 2017-10-20 NOTE — ED Notes (Signed)
Patient witnessed leaving the Emergency Room waiting room with security.

## 2017-10-20 NOTE — ED Provider Notes (Addendum)
[facility] Provider Note   CSN: [id number] Arrival date & time: 10/20/17  0535     History   Chief Complaint Chief Complaint  Patient presents with  . Abrasion    HPI Jack Castaneda is a 37 y.o. male.  The history is provided by the patient.  Illness  This is a recurrent problem. The current episode started yesterday. The problem occurs constantly. The problem has not changed since onset.Pertinent negatives include no chest pain, no abdominal pain, no headaches and no shortness of breath. Nothing aggravates the symptoms. Nothing relieves the symptoms. He has tried nothing for the symptoms. The treatment provided no relief.  Larey SeatFell and has Right knee and left hip abrasions.  He is bipolar but states he is not SI or HI and is not hallucinating and refuses to be seen for these issues.  He just wants the abrasions looked after.    Past Medical History:  Diagnosis Date  . Bipolar 1 disorder (HCC)     There are no active problems to display for this patient.   History reviewed. No pertinent surgical history.      Home Medications    Prior to Admission medications   Medication Sig Start Date End Date Taking? Authorizing Provider  amoxicillin (AMOXIL) 500 MG capsule Take 1 capsule (500 mg total) by mouth 3 (three) times daily. Patient not taking: Reported on 10/09/2016 06/07/16   Cathren LaineSteinl, Kevin, MD  ARIPiprazole (ABILIFY) 20 MG tablet Take 1 tablet (20 mg total) by mouth daily. 10/09/16   Charlynne PanderYao, David Hsienta, MD  hydrOXYzine (ATARAX/VISTARIL) 25 MG tablet Take 1 tablet (25 mg total) by mouth every 8 (eight) hours as needed for anxiety. 10/09/16   Charlynne PanderYao, David Hsienta, MD    Family History History reviewed. No pertinent family history.  Social History Social History   Tobacco Use  . Smoking status: Current Every Day Smoker    Packs/day: 1.00    Types: Cigarettes  . Smokeless tobacco: Never Used  Substance Use Topics  . Alcohol use: Yes      Comment: drinks 1/2 pint liquor once per week  . Drug use: Yes    Types: Marijuana     Allergies   Banana   Review of Systems Review of Systems  Respiratory: Negative for shortness of breath.   Cardiovascular: Negative for chest pain.  Gastrointestinal: Negative for abdominal pain.  Skin: Positive for wound.  Neurological: Negative for headaches.  All other systems reviewed and are negative.    Physical Exam Updated Vital Signs There were no vitals taken for this visit.  Physical Exam  Constitutional: He is oriented to person, place, and time. He appears well-developed and well-nourished. No distress.  HENT:  Head: Normocephalic and atraumatic. Head is without raccoon's eyes and without Battle's sign.  Mouth/Throat: No oropharyngeal exudate.  Eyes: Pupils are equal, round, and reactive to light. Conjunctivae are normal.  Neck: Normal range of motion. Neck supple.  Cardiovascular: Normal rate, regular rhythm, normal heart sounds and intact distal pulses.  Pulmonary/Chest: Effort normal and breath sounds normal. No stridor. He has no wheezes. He has no rales.  Abdominal: Soft. Bowel sounds are normal. He exhibits no mass. There is no tenderness. There is no rebound and no guarding.  Musculoskeletal: Normal range of motion.       Left hip: Normal.       Right knee: Normal. He exhibits normal range of motion, no swelling, no effusion, no ecchymosis, no deformity,  no erythema, normal alignment, no LCL laxity, normal patellar mobility, no bony tenderness, normal meniscus and no MCL laxity. No tenderness found. No medial joint line, no lateral joint line, no MCL, no LCL and no patellar tendon tenderness noted.       Right ankle: Normal. Achilles tendon normal.       Cervical back: Normal.       Thoracic back: Normal.       Lumbar back: Normal.       Right upper leg: Normal.       Right lower leg: Normal.       Right foot: Normal.  Neurological: He is alert and oriented to  person, place, and time. He displays normal reflexes.  Skin: Capillary refill takes less than 2 seconds.     Psychiatric: His affect is not labile. His speech is rapid and/or pressured. He is not actively hallucinating. He expresses no homicidal and no suicidal ideation. He expresses no suicidal plans and no homicidal plans.  Nursing note and vitals reviewed.    ED Treatments / Results  Labs (all labs ordered are listed, but only abnormal results are displayed) Labs Reviewed - No data to display  EKG None  Radiology No results found.  Procedures Procedures (including critical care time)  Medications Ordered in ED bacitracin  Final Clinical Impressions(s) / ED Diagnoses   Final diagnoses:  Abrasion    Bacitracin, wound care provided by me.   He does not meet criteria for IVC and is denying all mental health issues acutely.    Return for leg or calf swelling or pain, numbness, changes in vision or speech, fevers >100.4 unrelieved by medication, shortness of breath, intractable vomiting, or diarrhea, abdominal pain, Inability to tolerate liquids or food, cough, altered mental status or any concerns. No signs of systemic illness or infection. The patient is nontoxic-appearing on exam and vital signs are within normal limits. Will refer to urology for microscopy hematuria as patient is asymptomatic.  I have reviewed the triage vital signs and the nursing notes. Pertinent labs &imaging results that were available during my care of the patient were reviewed by me and considered in my medical decision making (see chart for details).  After history, exam, and medical workup I feel the patient has been appropriately medically screened and is safe for discharge home. Pertinent diagnoses were discussed with the patient. Patient was given return precautions.   Venancio Chenier, MD 10/20/17 1610    Cy Blamer, MD 10/20/17 [id number]

## 2017-10-20 NOTE — ED Notes (Signed)
Pt denies HI & SI. Dr Nicanor AlconPalumbo bandaged pt's wounds and pt discharged immediately.

## 2017-10-20 NOTE — ED Notes (Addendum)
Upon patient's arrival, staff attempted to assist patient to check in, patient became belligerent refusing to take stickers with his info on it, demanding that his arm band be cut off and stated that staff was not helping him and that all he needed was a band aid. Staff continued to attempt to assist patient to be checked in, but patient continued to act belligerent and stormed out of lobby. Patient then began stripping while walking towards [place]. Security attempted to locate patient but was unable to. Approximately 10 minutes later patient emerged from the bushes and returned stating that he wanted to be seen.

## 2017-11-05 ENCOUNTER — Emergency Department (HOSPITAL_COMMUNITY)
Admission: EM | Admit: 2017-11-05 | Discharge: 2017-11-05 | Disposition: A | Discharge status: Home / Self Care | Payer: Self-pay | Attending: Emergency Medicine | Admitting: Emergency Medicine

## 2017-11-05 ENCOUNTER — Other Ambulatory Visit: Payer: Self-pay

## 2017-11-05 ENCOUNTER — Encounter (HOSPITAL_COMMUNITY): Payer: Self-pay | Admitting: Emergency Medicine

## 2017-11-05 DIAGNOSIS — F418 Other specified anxiety disorders: Secondary | ICD-10-CM

## 2017-11-05 DIAGNOSIS — R45851 Suicidal ideations: Secondary | ICD-10-CM

## 2017-11-05 DIAGNOSIS — F1014 Alcohol abuse with alcohol-induced mood disorder: Secondary | ICD-10-CM

## 2017-11-05 DIAGNOSIS — F17210 Nicotine dependence, cigarettes, uncomplicated: Secondary | ICD-10-CM | POA: Insufficient documentation

## 2017-11-05 DIAGNOSIS — Z008 Encounter for other general examination: Secondary | ICD-10-CM | POA: Insufficient documentation

## 2017-11-05 DIAGNOSIS — R112 Nausea with vomiting, unspecified: Secondary | ICD-10-CM | POA: Insufficient documentation

## 2017-11-05 DIAGNOSIS — Z79899 Other long term (current) drug therapy: Secondary | ICD-10-CM | POA: Insufficient documentation

## 2017-11-05 DIAGNOSIS — F331 Major depressive disorder, recurrent, moderate: Secondary | ICD-10-CM | POA: Insufficient documentation

## 2017-11-05 LAB — COMPREHENSIVE METABOLIC PANEL
ALBUMIN: 4.3 g/dL (ref 3.5–5.0)
ALT: 69 U/L — ABNORMAL HIGH (ref 0–44)
AST: 110 U/L — AB (ref 15–41)
Alkaline Phosphatase: 104 U/L (ref 38–126)
Anion gap: 9 (ref 5–15)
BUN: 19 mg/dL (ref 6–20)
CALCIUM: 9.2 mg/dL (ref 8.9–10.3)
CHLORIDE: 99 mmol/L (ref 98–111)
CO2: 25 mmol/L (ref 22–32)
Creatinine, Ser: 1.12 mg/dL (ref 0.61–1.24)
GFR calc non Af Amer: >60 mL/min (ref >60)
GLUCOSE: 116 mg/dL — AB (ref 70–99)
Potassium: 4.3 mmol/L (ref 3.5–5.1)
Sodium: 133 mmol/L — ABNORMAL LOW (ref 135–145)
Total Bilirubin: 1.9 mg/dL — ABNORMAL HIGH (ref 0.3–1.2)
Total Protein: 7.7 g/dL (ref 6.5–8.1)

## 2017-11-05 LAB — URINALYSIS, ROUTINE W REFLEX MICROSCOPIC
Bacteria, UA: NONE SEEN
Bilirubin Urine: NEGATIVE
Glucose, UA: >=500 mg/dL — AB
Hgb urine dipstick: NEGATIVE
Ketones, ur: 5 mg/dL — AB
Leukocytes, UA: NEGATIVE
Nitrite: NEGATIVE
Protein, ur: NEGATIVE mg/dL
Specific Gravity, Urine: 1.014 (ref 1.005–1.030)
pH: 5 (ref 5.0–8.0)

## 2017-11-05 LAB — CBC
HEMATOCRIT: 42.3 % (ref 39.0–52.0)
Hemoglobin: 14 g/dL (ref 13.0–17.0)
MCH: 28.2 pg (ref 26.0–34.0)
MCHC: 33.1 g/dL (ref 30.0–36.0)
MCV: 85.1 fL (ref 78.0–100.0)
Platelets: 233 10*3/uL (ref 150–400)
RBC: 4.97 MIL/uL (ref 4.22–5.81)
RDW: 14.1 % (ref 11.5–15.5)
WBC: 6.5 10*3/uL (ref 4.0–10.5)

## 2017-11-05 LAB — LIPASE, BLOOD: LIPASE: 24 U/L (ref 11–51)

## 2017-11-05 MED ORDER — ARIPIPRAZOLE 10 MG PO TABS
20.0000 mg | ORAL_TABLET | Freq: Once | ORAL | Status: AC
  Administered 2017-11-05: 20 mg via ORAL
  Filled 2017-11-05: qty 2

## 2017-11-05 MED ORDER — ARIPIPRAZOLE 20 MG PO TABS: 20.0000 mg | ORAL_TABLET | Freq: Every day | ORAL | 0 refills | Status: DC

## 2017-11-05 MED ORDER — ARIPIPRAZOLE 20 MG PO TABS: 20.0000 mg | ORAL_TABLET | Freq: Once | ORAL | 0 refills | Status: DC

## 2017-11-05 MED ORDER — HYDROXYZINE HCL 25 MG PO TABS: 25.0000 mg | ORAL_TABLET | Freq: Four times a day (QID) | ORAL | Status: DC | PRN

## 2017-11-05 MED ORDER — LORAZEPAM 1 MG PO TABS: 1.0000 mg | ORAL_TABLET | Freq: Once | ORAL | Status: DC

## 2017-11-05 MED ORDER — HYDROXYZINE HCL 25 MG PO TABS: 25.0000 mg | ORAL_TABLET | Freq: Four times a day (QID) | ORAL | 0 refills | Status: DC | PRN

## 2017-11-05 NOTE — ED Notes (Addendum)
Pt wanting to get back on medications to help with mental health. Denies SI at this time, but states that don't know how long that will last without being on medications.

## 2017-11-05 NOTE — ED Triage Notes (Signed)
Patient here from home with complaints of abdominal pain x2 days. Reports that he is homeless, and needs a psych evaluation also, depression. Denies SI/HI.

## 2017-11-05 NOTE — ED Notes (Signed)
ED Provider at bedside. 

## 2017-11-05 NOTE — [facility] Counselor (Signed)
Per Nanine MeansJamison Lord, NP, patient does not require inpatient psychiatric hospitalization and has been recommended for discharge with recommendation to follow up with community agency for OPT.   TTS verbally [facility] with [facility]. Information included in discharge summary.
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
## 2017-11-05 NOTE — Discharge Instructions (Signed)
Patient does not require inpatient psychiatric hospitalization and has been recommended for discharge with recommendation to follow up with community agency for OPT.   Outpatient Resources   [facility]   Substance Abuse Treatment Programs  Intensive Outpatient Programs [facility]     [address]      [place], [place]                   [phone]       [facility] [address] [place] [address] [place], [place] [phone]  [facility]     (Inpatient and outpatient)     [address]           [phone]    [facility] [phone] (Suboxone and Methadone)  [address]      [place], [place] [address]      [phone]       [address] [place], [place] [phone]  [facility] (Outpatient/Inpatient, [employer])    (insurance only) [phone]             [facility] (Groups & Residential) [place], [place] [phone]     [facility]     [address]     [place], [place]      [phone]       [facility] (for caregivers and family) [phone] [place], [place] [phone]      Residential Treatment Programs [facility]      [address] [place], [place] [address]  [phone]       [facility] [address] [place], [place] [address] [phone]  [facility] [place]        [phone]       [facility] [phone]  [facility] ([facility]             [address]                                         [place], [place]                                                [phone] or [phone]                               [facility] [address] [place], [address] [phone]  [facility]    [address]      [place], [place]     [phone]       [facility] [address] [place], [place] [phone]  [facility]   [address] [place], [place] [address]     [phone]      Admissions: 8am-3pm M-F  [facility] (RTS) [address] [place], [place] [phone]  [facility]: Residential Program [phone] Days)   [place], [place]      [phone] or [phone]     [facility]ATC: [facility] [place], [place] (Walk in Hours over the weekend or by referral)  [facility] [address] [place], [place], [place] [address] [phone]  Crisis Mobile: [facility]:  [phone] (for crisis response 24 hours a day) [facility] Hotline:      [phone] Outpatient Psychiatry and Counseling  [facility]: Mobile Crisis Management 24 hours:  [phone]  [facility] of the [employer] sliding scale fee and walk in schedule: M-F 8am-12pm/1pm-3pm [address]  [place], [place] [address] [phone]  [facility] [address] [place], [place] [address] [phone]  [facility] (Formerly known as [facility] [employer])- new patient walk-in appointments available Monday - Friday 8am -3pm.          [address] [place], [place] [address] [phone] or crisis line- [phone]  [facility] Services/ Intensive Outpatient Therapy Program [address] [place], [place] [address] [phone]  [facility]                  Crisis Services      [phone]     [place], [place] [address]                 [facility]   [facility] [phone]. [address] [place], [place] [address]   [employer] of Care          [address] Laura  [place], [place] [address]       [phone]  [facility] [address] [place], [place] [address] [phone]  [facility]    [address]    [place], [place] [address]     [phone]       Andee Poles, MD     [address]     [place] [address]     [phone]       [facility] [address] [place] [address]  [facility]     [address] [place], [place]      [phone]       [facility] Eulogio Ditch, MD [address] [place], [place] [address] [phone]  [facility]     [address]     [place], [place] [address]     [phone]       [facility] [address] [place], [place] [address] [phone] Resources for Temporary Residential Assistance/Crisis Centers  [facility]) M-F 8am-3pm   [address] [place], [place] [address]   [phone] Services include: laundry, barbering, support groups, case management, phone  & computer access, showers, AA/NA mtgs, mental health/substance abuse nurse, job skills class, disability information, VA assistance, spiritual classes, etc.   HOMELESS SHELTERS  [facility]     [facility]   [address], [place]     [phone]              [employer] (women and children)       [address] [place], [place] [address] [phone] [email or web address] for application and process Application Required  [facility]   [address]    [place] [address]     [phone]                    [facility] [address] [place], [place] [address] [phone] application appt.)  Application Required  [facility] (women only)    [address]     [place], [place] [address]     [phone]      Intake starts 6pm daily Need valid ID, SSC, & Police report [facility] [place] [address] [place], [place] [phone] Application Required  [facility] (men only)      [address]      [place], [place]     [phone]       [facility] (Pregnant women only) [address] [place], [place] [phone]  [facility]      [address]      [place], [place] [address]     [phone]             [facility] [address] [place], [place] [phone] 90 day commitment/SA/Application process  [facility](men only)     [address]     [place], [place]     [phone]       Check-in at [facility]7pm            [facility] [address] [place], [place] [address] [phone] Men/Women/Women and Children must be there by 7 pm  [facility] [place], [place] [phone]                 [address], [place], [place]  [address] [phone]  [facility]:  [address] [place], [place] [address] [phone]  [facility]  [address] [place], [place] [address] [phone]  [facility] [address] [place], [place]  [address] [phone]  Psychotherapeutic Services  [address]  [place], [place] [address] [phone] (offers ACTT Team)

## 2017-11-05 NOTE — ED Notes (Signed)
TTS at bedside. 

## 2018-06-17 ENCOUNTER — Other Ambulatory Visit: Payer: Self-pay

## 2018-06-17 ENCOUNTER — Emergency Department (HOSPITAL_COMMUNITY)
Admission: EM | Admit: 2018-06-17 | Discharge: 2018-06-17 | Disposition: A | Discharge status: Home / Self Care | Payer: Self-pay | Attending: Emergency Medicine | Admitting: Emergency Medicine

## 2018-06-17 ENCOUNTER — Emergency Department (HOSPITAL_COMMUNITY): Payer: Self-pay

## 2018-06-17 ENCOUNTER — Encounter (HOSPITAL_COMMUNITY): Payer: Self-pay | Admitting: Emergency Medicine

## 2018-06-17 ENCOUNTER — Emergency Department (HOSPITAL_COMMUNITY): Admission: EM | Admit: 2018-06-17 | Discharge: 2018-06-17 | Discharge status: Absent without leave | Payer: Self-pay

## 2018-06-17 DIAGNOSIS — F319 Bipolar disorder, unspecified: Secondary | ICD-10-CM | POA: Insufficient documentation

## 2018-06-17 DIAGNOSIS — R42 Dizziness and giddiness: Secondary | ICD-10-CM | POA: Insufficient documentation

## 2018-06-17 DIAGNOSIS — G44309 Post-traumatic headache, unspecified, not intractable: Secondary | ICD-10-CM | POA: Insufficient documentation

## 2018-06-17 DIAGNOSIS — S060X9A Concussion with loss of consciousness of unspecified duration, initial encounter: Secondary | ICD-10-CM | POA: Insufficient documentation

## 2018-06-17 DIAGNOSIS — Y999 Unspecified external cause status: Secondary | ICD-10-CM | POA: Insufficient documentation

## 2018-06-17 DIAGNOSIS — Y929 Unspecified place or not applicable: Secondary | ICD-10-CM | POA: Insufficient documentation

## 2018-06-17 DIAGNOSIS — K029 Dental caries, unspecified: Secondary | ICD-10-CM | POA: Insufficient documentation

## 2018-06-17 DIAGNOSIS — F17210 Nicotine dependence, cigarettes, uncomplicated: Secondary | ICD-10-CM | POA: Insufficient documentation

## 2018-06-17 DIAGNOSIS — Z79899 Other long term (current) drug therapy: Secondary | ICD-10-CM | POA: Insufficient documentation

## 2018-06-17 DIAGNOSIS — S060X0A Concussion without loss of consciousness, initial encounter: Secondary | ICD-10-CM

## 2018-06-17 DIAGNOSIS — Y939 Activity, unspecified: Secondary | ICD-10-CM | POA: Insufficient documentation

## 2018-06-17 LAB — COMPREHENSIVE METABOLIC PANEL
ALBUMIN: 4.4 g/dL (ref 3.5–5.0)
ALT: 21 U/L (ref 0–44)
ANION GAP: 6 (ref 5–15)
AST: 23 U/L (ref 15–41)
Alkaline Phosphatase: 75 U/L (ref 38–126)
BUN: 9 mg/dL (ref 6–20)
CALCIUM: 9.3 mg/dL (ref 8.9–10.3)
CO2: 26 mmol/L (ref 22–32)
CREATININE: 1.01 mg/dL (ref 0.61–1.24)
Chloride: 106 mmol/L (ref 98–111)
GFR calc non Af Amer: >60 mL/min (ref >60)
Glucose, Bld: 80 mg/dL (ref 70–99)
Potassium: 4.1 mmol/L (ref 3.5–5.1)
SODIUM: 138 mmol/L (ref 135–145)
Total Bilirubin: 1.3 mg/dL — ABNORMAL HIGH (ref 0.3–1.2)
Total Protein: 7.7 g/dL (ref 6.5–8.1)

## 2018-06-17 LAB — CBC WITH DIFFERENTIAL/PLATELET
ABS IMMATURE GRANULOCYTES: 0.01 10*3/uL (ref 0.00–0.07)
BASOS ABS: 0.1 10*3/uL (ref 0.0–0.1)
BASOS PCT: 2 %
Eosinophils Absolute: 0.5 10*3/uL (ref 0.0–0.5)
Eosinophils Relative: 10 %
HCT: 47.5 % (ref 39.0–52.0)
Hemoglobin: 15 g/dL (ref 13.0–17.0)
Immature Granulocytes: 0 %
LYMPHS PCT: 46 %
Lymphs Abs: 2.3 10*3/uL (ref 0.7–4.0)
MCH: 28 pg (ref 26.0–34.0)
MCHC: 31.6 g/dL (ref 30.0–36.0)
MCV: 88.8 fL (ref 80.0–100.0)
MONO ABS: 0.4 10*3/uL (ref 0.1–1.0)
Monocytes Relative: 9 %
NEUTROS ABS: 1.6 10*3/uL — AB (ref 1.7–7.7)
NRBC: 0 % (ref 0.0–0.2)
Neutrophils Relative %: 33 %
Platelets: 227 10*3/uL (ref 150–400)
RBC: 5.35 MIL/uL (ref 4.22–5.81)
RDW: 13.9 % (ref 11.5–15.5)
WBC: 4.9 10*3/uL (ref 4.0–10.5)

## 2018-06-17 LAB — PROTIME-INR
INR: 0.96
Prothrombin Time: 12.7 seconds (ref 11.4–15.2)

## 2018-06-17 LAB — ACETAMINOPHEN LEVEL: ACETAMINOPHEN (TYLENOL), SERUM: 12 ug/mL (ref 10–30)

## 2018-06-17 LAB — APTT: APTT: 27 s (ref 24–36)

## 2018-06-17 MED ORDER — METOCLOPRAMIDE HCL 5 MG/ML IJ SOLN
10.0000 mg | Freq: Once | INTRAMUSCULAR | Status: AC
  Administered 2018-06-17: 10 mg via INTRAVENOUS
  Filled 2018-06-17: qty 2

## 2018-06-17 MED ORDER — KETOROLAC TROMETHAMINE 15 MG/ML IJ SOLN
15.0000 mg | Freq: Once | INTRAMUSCULAR | Status: AC
  Administered 2018-06-17: 15 mg via INTRAVENOUS
  Filled 2018-06-17: qty 1

## 2018-06-17 MED ORDER — DIPHENHYDRAMINE HCL 50 MG/ML IJ SOLN
12.5000 mg | Freq: Once | INTRAMUSCULAR | Status: AC
  Administered 2018-06-17: 12.5 mg via INTRAVENOUS
  Filled 2018-06-17: qty 1

## 2018-06-17 MED ORDER — SODIUM CHLORIDE 0.9 % IV BOLUS
1000.0000 mL | Freq: Once | INTRAVENOUS | Status: AC
  Administered 2018-06-17: 1000 mL via INTRAVENOUS
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
## 2018-06-17 NOTE — ED Triage Notes (Signed)
No answer x 4  

## 2018-06-17 NOTE — ED Notes (Signed)
Patient transported to CT 

## 2018-06-17 NOTE — Discharge Instructions (Addendum)
You have been diagnosed today with postconcussion headache.  At this time there does not appear to be the presence of an emergent medical condition, however there is always the potential for conditions to change. Please read and follow the below instructions.  Please return to the Emergency Department immediately for any new or worsening symptoms. Please be sure to follow up with your Primary Care Provider within one week regarding your visit today; please call their office to schedule an appointment even if you are feeling better for a follow-up visit.  If you do not have a primary care provider you may follow-up with the [facility] on your discharge paperwork. You may contact the concussion clinic at [facility] on your discharge paperwork for further evaluation of your concussion. Please avoid excessive use of Tylenol as this can lead to dangerous side effects.  Please only use over-the-counter Tylenol and ibuprofen as directed on the packaging.  Do not take more than 2000 mg of Tylenol in 1 day.  Drink plenty of water and get plenty of rest to help with your symptoms.  Get help right away if: You have severe or worsening headaches. You have weakness or numbness in any part of your body. You are confused. Your coordination gets worse. You vomit repeatedly. You are sleepier than normal. You have convulsions. Your speech is slurred. You cannot recognize people or places. You have a seizure. It is difficult to wake you up. You have unusual behavior changes. You have changes in your vision. You lose consciousness. Any new or concerning symptoms  Please read the additional information packets attached to your discharge summary.  Do not take your medicine if  develop an itchy rash, swelling in your mouth or lips, or difficulty breathing.

## 2019-04-21 ENCOUNTER — Other Ambulatory Visit: Payer: Self-pay

## 2019-04-21 ENCOUNTER — Emergency Department (HOSPITAL_COMMUNITY)
Admission: EM | Admit: 2019-04-21 | Discharge: 2019-04-21 | Disposition: A | Discharge status: Home / Self Care | Payer: Self-pay | Attending: Emergency Medicine | Admitting: Emergency Medicine

## 2019-04-21 ENCOUNTER — Encounter (HOSPITAL_COMMUNITY): Payer: Self-pay | Admitting: Emergency Medicine

## 2019-04-21 DIAGNOSIS — F329 Major depressive disorder, single episode, unspecified: Secondary | ICD-10-CM

## 2019-04-21 DIAGNOSIS — F339 Major depressive disorder, recurrent, unspecified: Secondary | ICD-10-CM | POA: Insufficient documentation

## 2019-04-21 DIAGNOSIS — Z79899 Other long term (current) drug therapy: Secondary | ICD-10-CM | POA: Insufficient documentation

## 2019-04-21 DIAGNOSIS — F1014 Alcohol abuse with alcohol-induced mood disorder: Secondary | ICD-10-CM | POA: Insufficient documentation

## 2019-04-21 DIAGNOSIS — R442 Other hallucinations: Secondary | ICD-10-CM | POA: Insufficient documentation

## 2019-04-21 DIAGNOSIS — F17210 Nicotine dependence, cigarettes, uncomplicated: Secondary | ICD-10-CM | POA: Insufficient documentation

## 2019-04-21 DIAGNOSIS — F32A Depression, unspecified: Secondary | ICD-10-CM

## 2019-04-21 HISTORY — DX: Depression, unspecified: F32.A

## 2019-04-21 LAB — COMPREHENSIVE METABOLIC PANEL
ALT: 28 U/L (ref 0–44)
AST: 25 U/L (ref 15–41)
Albumin: 4.5 g/dL (ref 3.5–5.0)
Alkaline Phosphatase: 85 U/L (ref 38–126)
Anion gap: 8 (ref 5–15)
BUN: 10 mg/dL (ref 6–20)
CO2: 26 mmol/L (ref 22–32)
Calcium: 9.3 mg/dL (ref 8.9–10.3)
Chloride: 104 mmol/L (ref 98–111)
Creatinine, Ser: 1.06 mg/dL (ref 0.61–1.24)
GFR calc Af Amer: >60 mL/min (ref >60)
GFR calc non Af Amer: >60 mL/min (ref >60)
Glucose, Bld: 108 mg/dL — ABNORMAL HIGH (ref 70–99)
Potassium: 4.1 mmol/L (ref 3.5–5.1)
Sodium: 138 mmol/L (ref 135–145)
Total Bilirubin: 0.9 mg/dL (ref 0.3–1.2)
Total Protein: 7.3 g/dL (ref 6.5–8.1)

## 2019-04-21 LAB — CBC
HCT: 45.6 % (ref 39.0–52.0)
Hemoglobin: 15 g/dL (ref 13.0–17.0)
MCH: 28.4 pg (ref 26.0–34.0)
MCHC: 32.9 g/dL (ref 30.0–36.0)
MCV: 86.2 fL (ref 80.0–100.0)
Platelets: 203 10*3/uL (ref 150–400)
RBC: 5.29 MIL/uL (ref 4.22–5.81)
RDW: 13.4 % (ref 11.5–15.5)
WBC: 4.4 10*3/uL (ref 4.0–10.5)
nRBC: 0 % (ref 0.0–0.2)

## 2019-04-21 LAB — ETHANOL: Alcohol, Ethyl (B): <10 mg/dL (ref <10)

## 2019-04-21 LAB — RAPID URINE DRUG SCREEN, HOSP PERFORMED
Amphetamines: NOT DETECTED
Barbiturates: NOT DETECTED
Benzodiazepines: NOT DETECTED
Cocaine: NOT DETECTED
Opiates: NOT DETECTED
Tetrahydrocannabinol: POSITIVE — AB

## 2019-04-21 LAB — ACETAMINOPHEN LEVEL: Acetaminophen (Tylenol), Serum: <10 ug/mL — ABNORMAL LOW (ref 10–30)

## 2019-04-21 LAB — SALICYLATE LEVEL: Salicylate Lvl: <7 mg/dL — ABNORMAL LOW (ref 7.0–30.0)

## 2019-04-21 MED ORDER — NICOTINE 21 MG/24HR TD PT24: 21.0000 mg | MEDICATED_PATCH | Freq: Every day | TRANSDERMAL | Status: DC | PRN

## 2019-04-21 NOTE — ED Notes (Signed)
ALL belongings - 3 labeled belongings bags - returned to pt - Pt voiced all belongings present. Pt aware waiting for referrals from [facility] then will be d/c'd.

## 2019-04-21 NOTE — ED Triage Notes (Signed)
C/o depression that has been worse over the past 2-3 days.  Pt denies suicidal/homicidal ideation.  States "people want me to smile when I don't feel like smiling."  States he went to [facility] but was told to come to ED because they were on a "COVID shutdown".    States people were upset with him because he didn't get a unemployment check but he had no control over it.

## 2019-04-21 NOTE — ED Notes (Signed)
Pt arrived to [facility] - ambulatory - wearing burgundy scrubs. Pt noted to be alert, oriented, calm, cooperative. Pt c/o depression d/t COVID-19. Denies SI/HI. Pt's belongings - 3 labeled belongings bags - placed at nurses' desk for inventory. Pt voiced understanding of Medical Clearance Pt Policy form including 2 phone calls daily. Aware waiting for TTS to be performed. Pt given tv remote as requested so may watch tv. Dinner tray ordered.

## 2019-04-21 NOTE — ED Notes (Signed)
TTS completed. Pt states he is hoping to go home tonight w/[facility]. Continues to deny SI/HI. Pt on phone at nurses' desk.

## 2019-04-21 NOTE — ED Provider Notes (Signed)
[facility]   CSN: [id number] Arrival date & time: 04/21/19  1444     History Chief Complaint  Patient presents with  . Depression    Jack Castaneda is a 38 y.o. male with a history of bipolar 1, depression, who presents today for evaluation of mood swings and depression.  He states that he used to be on psychiatric medications however he is not currently taking any.  He states that over the past few weeks he has been feeling more and more depressed and noticing significant mood swings.  He states that he got to the point today where he was afraid that he was going to lose control and hurt someone.  He states that he wants help getting medications and psychiatric treatment as he does not want to be that kind of person or act that way in front of his wife or children.  He states that he did get a job with a [employer]ist, [employer], however due to the global pandemic that did not happen which has been causing him additional stress.  He denies any alcohol or drug use.  He reports feeling significantly overwhelmed.  He denies SI or HI.  He does report that at night he thinks he sees things out of the corners of his eyes however also notes that he enjoys watching scary movies.  He denies auditory hallucinations.   HPI     Past Medical History:  Diagnosis Date  . Bipolar 1 disorder (HCC)   . Depression     Patient Active Problem List   Diagnosis Date Noted  . Alcohol abuse with alcohol-induced mood disorder (HCC) 11/05/2017    History reviewed. No pertinent surgical history.     No family history on file.  Social History   Tobacco Use  . Smoking status: Current Every Day Smoker    Packs/day: 0.50    Types: Cigarettes  . Smokeless tobacco: Never Used  Substance Use Topics  . Alcohol use: Yes    Comment: drinks 1/2 pint liquor once per week  . Drug use: Not Currently    Types: Marijuana    Home Medications Prior to  Admission medications   Medication Sig Start Date End Date Taking? Authorizing Provider  ARIPiprazole (ABILIFY) 20 MG tablet Take 1 tablet (20 mg total) by mouth once for 1 dose. Patient not taking: Reported on 04/21/2019 11/05/17 04/21/19  Charm RingsLord, Jamison Y, NP  ARIPiprazole (ABILIFY) 20 MG tablet Take 1 tablet (20 mg total) by mouth daily. Patient not taking: Reported on 04/21/2019 11/05/17   Margarita Grizzleay, Danielle, MD  hydrOXYzine (ATARAX/VISTARIL) 25 MG tablet Take 1 tablet (25 mg total) by mouth every 6 (six) hours as needed for anxiety. Patient not taking: Reported on 04/21/2019 11/05/17   Charm RingsLord, Jamison Y, NP    Allergies    Banana and Dust mite extract  Review of Systems   Review of Systems  Constitutional: Negative for chills and fever.  HENT: Negative for congestion.   Eyes: Negative for visual disturbance.  Respiratory: Negative for chest tightness and shortness of breath.   Cardiovascular: Negative for chest pain.  Gastrointestinal: Negative for abdominal pain, nausea and vomiting.  Genitourinary: Negative for dysuria.  Musculoskeletal: Negative for back pain and neck pain.  Skin: Negative for color change and wound.  Neurological: Negative for headaches.  Psychiatric/Behavioral: Positive for behavioral problems (Mood swings, ) and dysphoric mood. Negative for self-injury, sleep disturbance and suicidal ideas. The patient is not nervous/anxious.  All other systems reviewed and are negative.   Physical Exam Updated Vital Signs BP 115/75   Pulse (!) 57   Temp 98.6 F (37 C)   Resp 16   SpO2 98%   Physical Exam Vitals and nursing note reviewed.  Constitutional:      General: He is not in acute distress.    Appearance: He is well-developed. He is not diaphoretic.  HENT:     Head: Normocephalic and atraumatic.  Eyes:     General: No scleral icterus.       Right eye: No discharge.        Left eye: No discharge.     Conjunctiva/sclera: Conjunctivae normal.  Cardiovascular:      Rate and Rhythm: Normal rate and regular rhythm.  Pulmonary:     Effort: Pulmonary effort is normal. No respiratory distress.     Breath sounds: No stridor.  Abdominal:     General: There is no distension.  Musculoskeletal:        General: No deformity.     Cervical back: Normal range of motion.  Skin:    General: Skin is warm and dry.  Neurological:     Mental Status: He is alert and oriented to person, place, and time.     Motor: No abnormal muscle tone.     Comments: No slurred speech or facial droop.  Psychiatric:        Attention and Perception: Attention and perception normal.        Mood and Affect: Mood is anxious and depressed. Affect is tearful.        Judgment: Judgment is impulsive.     Comments: Tearful, speech is rapid however does not appear pressured or tangential.  Normal behavior, is cooperative.  Denies SI or HI.      ED Results / Procedures / Treatments   Labs (all labs ordered are listed, but only abnormal results are displayed) Labs Reviewed  COMPREHENSIVE METABOLIC PANEL - Abnormal; Notable for the following components:      Result Value   Glucose, Bld 108 (*)    All other components within normal limits  SALICYLATE LEVEL - Abnormal; Notable for the following components:   Salicylate Lvl <8.7 (*)    All other components within normal limits  ACETAMINOPHEN LEVEL - Abnormal; Notable for the following components:   Acetaminophen (Tylenol), Serum <10 (*)    All other components within normal limits  RAPID URINE DRUG SCREEN, HOSP PERFORMED - Abnormal; Notable for the following components:   Tetrahydrocannabinol POSITIVE (*)    All other components within normal limits  ETHANOL  CBC    EKG None  Radiology No results found.  Procedures Procedures (including critical care time)  Medications Ordered in ED Medications  nicotine (NICODERM CQ - dosed in mg/24 hours) patch 21 mg (has no administration in time range)    ED Course  I have reviewed  the triage vital signs and the nursing notes.  Pertinent labs & imaging results that were available during my care of the patient were reviewed by me and considered in my medical decision making (see chart for details).    MDM Rules/Calculators/A&P                     Patient presents today for evaluation of mood swings and depression.  He reports that he has been under multiple stressors recently.  He had previously been on medications for depression however he states he is  not currently taking any.  He states that overall he feels very depressed.  He states that he became angry today and almost struck someone and does not want to be that kind of person around his family.  Patient is tearful on exam.  He denies any medical conditions.  He does smoke and was offered a nicotine patch which he declined.  He denies any alcohol use.  He does report occasional CBD and marijuana use however denies other substances.    He denies SI, HI.  Does report that he occasionally sees things, mostly at night, out of the corners of his eyes.  He denies auditory hallucinations.  Twelve-lead EKG is pending at this time to evaluate for QT interval prolongation in case this is needed for psychiatric medications, however as he is not currently having any chest pain or shortness of breath this does not preclude him being medically cleared.  UDS is pending.  After patient is seen by TTS appropriate Covid test will be ordered based on disposition.    Patient is medically clear for psychiatric evaluation and disposition.  Patient remained hemodynamically stable while in my care.  Patient was evaluated by [facility]  With TTS, whose note reports that patient does not meet inpatient criteria and recommended discharge with outpatient resources.   Return precautions were discussed with patient who states their understanding.  At the time of discharge patient denied any unaddressed complaints or concerns.  Patient is  agreeable for discharge home.  Note: Portions of this report may have been transcribed using voice recognition software. Every effort was made to ensure accuracy; however, inadvertent computerized transcription errors may be present   Final Clinical Impression(s) / ED Diagnoses Final diagnoses:  Depression, unspecified depression type    Rx / DC Orders ED Discharge Orders    None       Cristina Gong, PA-C 04/22/19 0004    Virgina Norfolk, DO 04/22/19 0022

## 2019-04-21 NOTE — ED Notes (Signed)
Resources discussed and given - voiced understanding to follow up. D/C instructions given and questions answered to satisfaction.

## 2019-04-21 NOTE — BH Assessment (Signed)
Tele Assessment Note   Patient Name: Jack Castaneda MRN: [id number] Referring Physician: Wyn Quaker Location of Patient: [facility] Location of Provider: Behavioral Health TTS Department  ERICH KOCHAN is an 38 y.o. male presenting with worsening depression and mood swings. Patient has history of bipolar 1 and depression. Patient reported after feeling like he was going to hurt somebody, he felt he needed to talk to someone about his mental health treatment. Patient stated "I flipped out today, all I do is hold stuff, you can call me names, I don't show emotion, I need some help, someone to talk to and tell me I am going to be okay". Patient continued to state that he is a good person. Patient denied SI and HI. Patient reported contacting [facility], whom he stated they were closed because of pandemic and referred him to go to the emergency room. Patient reported he has not been on his mood stabilizer medications in over a year because of the high price and that he can no longer manage his symptoms. Patient reported stressors/triggers include the pandemic, his in-laws, being a parent, taking care of other people and being belittled everyday by his wife. Patient reported loving his daughter and enjoying taking care of her. Patient reported only sleeping 2 hours and poor appetite. Patient has been married to wife for 9 years, with a 29 year old and currently pregnant. Also in the home is wife's other 3 children and patients mother-in-law. Patient was tearful, cooperative and pleasant during assessment.   Collateral Contact: Walt Geathers, wife, [phone] Charise Carwin reported no concerns regarding patient harming himself or others. Laquita reported patient holds in his emotions and does not talk about his feelings and that today he became overwhelmed and frustrated. Laquita reported patient is a good person and father. Laquita reported when she tries to talk to him it turns into a screaming match, therefore  she does not know how to help him. Laquita reported patient needs a therapist to see on a regularly basis, along with medication management.   Diagnosis: Major depressive disorder  Past Medical History:  Past Medical History:  Diagnosis Date  . Bipolar 1 disorder ([place])   . Depression     History reviewed. No pertinent surgical history.  Family History: No family history on file.  Social History:  reports that he has been smoking cigarettes. He has been smoking about 0.50 packs per day. He has never used smokeless tobacco. He reports current alcohol use. He reports previous drug use. Drug: Marijuana.  Additional Social History:  Alcohol / Drug Use Pain Medications: see MAR Prescriptions: see MAR Over the Counter: see MAR  CIWA: CIWA-Ar BP: 136/87 Pulse Rate: (!) 58 COWS:    Allergies:  Allergies  Allergen Reactions  . Banana Nausea Only    Home Medications: (Not in a hospital admission)   OB/GYN Status:  No LMP for male patient.  General Assessment Data Location of Assessment: [facility] ED TTS Assessment: In system Is this a Tele or Face-to-Face Assessment?: Tele Assessment Is this an Initial Assessment or a Re-assessment for this encounter?: Initial Assessment Patient Accompanied by:: N/A Language Other than English: No Living Arrangements: (family home) What gender do you identify as?: Male Marital status: Married Admission Status: Voluntary Is patient capable of signing voluntary admission?: Yes Referral Source: Self/Family/Friend     Crisis Care Plan Legal Guardian: (self) Name of Psychiatrist: (none) Name of Therapist: (none)  Education Status Is patient currently in school?: No Is the patient employed,  unemployed or receiving disability?: Employed  Risk to self with the past 6 months Suicidal Ideation: No Has patient been a risk to self within the past 6 months prior to admission? : No Suicidal Intent: No Has patient had any suicidal intent within the  past 6 months prior to admission? : No Is patient at risk for suicide?: No Suicidal Plan?: No Has patient had any suicidal plan within the past 6 months prior to admission? : No Access to Means: No What has been your use of drugs/alcohol within the last 12 months?: (marijuana) Previous Attempts/Gestures: No How many times?: (0) Other Self Harm Risks: (none) Triggers for Past Attempts: (n/a) Intentional Self Injurious Behavior: None Family Suicide History: No Recent stressful life event(s): (family conflict) Persecutory voices/beliefs?: No Depression: Yes Depression Symptoms: Tearfulness, Insomnia, Isolating, Fatigue Substance abuse history and/or treatment for substance abuse?: No Suicide prevention information given to non-admitted patients: Not applicable  Risk to Others within the past 6 months Homicidal Ideation: No Does patient have any lifetime risk of violence toward others beyond the six months prior to admission? : No Thoughts of Harm to Others: No Current Homicidal Intent: No Current Homicidal Plan: No Access to Homicidal Means: No Identified Victim: (n/a) History of harm to others?: No Assessment of Violence: None Noted Violent Behavior Description: (none) Does patient have access to weapons?: No Criminal Charges Pending?: No Does patient have a court date: No Is patient on probation?: No  Psychosis Hallucinations: None noted Delusions: None noted  Mental Status Report Appearance/Hygiene: Unremarkable Eye Contact: Good Motor Activity: Freedom of movement Speech: Logical/coherent Level of Consciousness: Alert Mood: Depressed, Anxious, Pleasant Affect: Appropriate to circumstance, Depressed, Anxious Anxiety Level: Moderate Thought Processes: Coherent, Relevant Judgement: Partial Orientation: Person, Place, Time, Situation Obsessive Compulsive Thoughts/Behaviors: None  Cognitive Functioning Concentration: Fair Memory: Recent Intact Is patient IDD:  No Insight: Fair Impulse Control: Fair Appetite: Poor Have you had any weight changes? : No Change Sleep: No Change Total Hours of Sleep: (2) Vegetative Symptoms: None  ADLScreening [facility] Assessment Services) Patient's cognitive ability adequate to safely complete daily activities?: Yes Patient able to express need for assistance with ADLs?: Yes Independently performs ADLs?: Yes (appropriate for developmental age)  Prior Inpatient Therapy Prior Inpatient Therapy: No  Prior Outpatient Therapy Prior Outpatient Therapy: Yes Prior Therapy Dates: (last year ) Prior Therapy Facilty/Provider(s): [facility]) Reason for Treatment: (depression and bipolar) Does patient have an ACCT team?: No Does patient have Intensive In-House Services?  : No Does patient have [facility] services? : No Does patient have P4CC services?: No  ADL Screening (condition at time of admission) Patient's cognitive ability adequate to safely complete daily activities?: Yes Patient able to express need for assistance with ADLs?: Yes Independently performs ADLs?: Yes (appropriate for developmental age)  Merchant navy officer (For Healthcare) Does Patient Have a Medical Advance Directive?: No Would patient like information on creating a medical advance directive?: No - Patient declined  Disposition:  Disposition Initial Assessment Completed for this Encounter: Yes  Nira Conn, NP, patient does not meet inpatient criteria. Patient will follow up with outpatient resources. TTS to fax outpatient resources to patients nurse.   This service was provided via telemedicine using a 2-way, interactive audio and video technology.  Names of all persons participating in this telemedicine service and their role in this encounter. Name: Rivka Safer Role: Patient  Name: Al Corpus Role: TTS Clinician  Name:  Role:   Name: Role:     Burnetta Sabin 04/21/2019 8:05 PM

## 2019-04-21 NOTE — ED Notes (Signed)
TTS being performed.  

## 2019-04-21 NOTE — ED Notes (Signed)
Per Jack Castaneda, [place], pt has been psych cleared - faxing over referrals for pt.

## 2019-08-14 ENCOUNTER — Emergency Department (HOSPITAL_COMMUNITY): Payer: No Typology Code available for payment source

## 2019-08-14 ENCOUNTER — Emergency Department (HOSPITAL_COMMUNITY)
Admission: EM | Admit: 2019-08-14 | Discharge: 2019-08-14 | Disposition: A | Discharge status: Home / Self Care | Payer: No Typology Code available for payment source | Attending: Emergency Medicine | Admitting: Emergency Medicine

## 2019-08-14 ENCOUNTER — Other Ambulatory Visit: Payer: Self-pay

## 2019-08-14 ENCOUNTER — Encounter (HOSPITAL_COMMUNITY): Payer: Self-pay

## 2019-08-14 DIAGNOSIS — R519 Headache, unspecified: Secondary | ICD-10-CM | POA: Diagnosis not present

## 2019-08-14 DIAGNOSIS — M7918 Myalgia, other site: Secondary | ICD-10-CM | POA: Diagnosis not present

## 2019-08-14 DIAGNOSIS — M545 Low back pain: Secondary | ICD-10-CM | POA: Diagnosis not present

## 2019-08-14 MED ORDER — CYCLOBENZAPRINE HCL 10 MG PO TABS: 10.0000 mg | ORAL_TABLET | Freq: Two times a day (BID) | ORAL | 0 refills | Status: AC | PRN

## 2019-08-14 MED ORDER — CYCLOBENZAPRINE HCL 10 MG PO TABS
5.0000 mg | ORAL_TABLET | Freq: Once | ORAL | Status: AC
  Administered 2019-08-14: 13:00:00 5 mg via ORAL
  Filled 2019-08-14: qty 1
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
## 2021-06-23 IMAGING — CT CT HEAD W/O CM
3 series · 14 of 47 positions shown, 16 images · non-contrast
Comparison: 06/17/2018

CLINICAL DATA: Headache, posttraumatic

EXAM:
CT HEAD WITHOUT CONTRAST
TECHNIQUE: Contiguous axial images were obtained from the base of the skull
through the vertex without intravenous contrast.

[Series 3: head 5.0 h30s · axial · 0.44mm/px · z∈[-84,+41]mm · 8 of 31 slices shown, 10 images]
[im 3/31  brain]
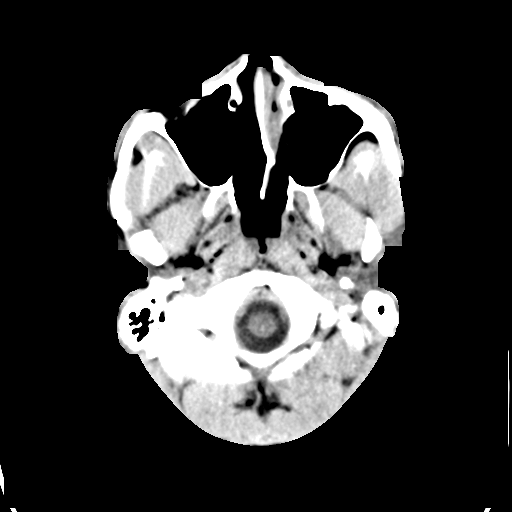
[im 3/31  bone]
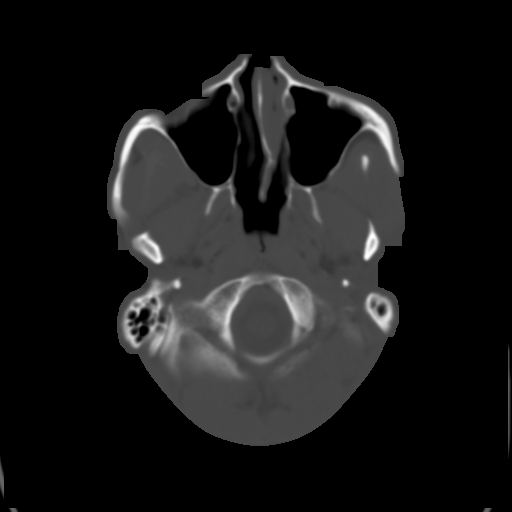
[im 7/31  brain]
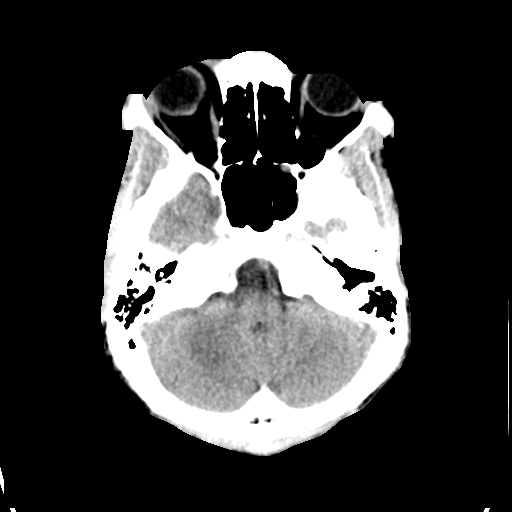
[im 10/31  brain]
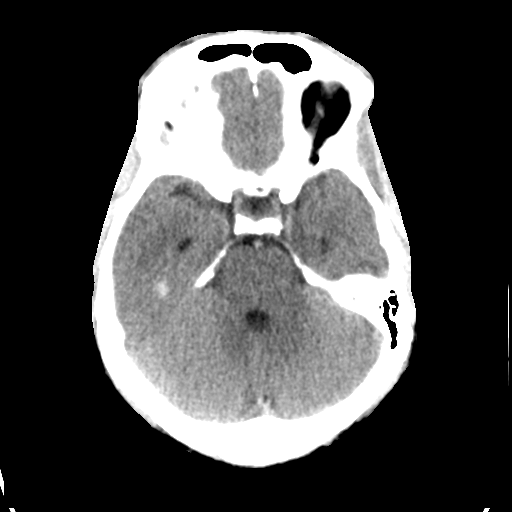
[im 14/31  brain]
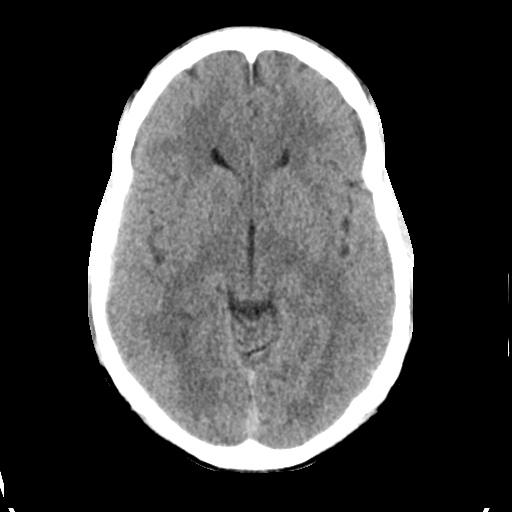
[im 17/31  brain]
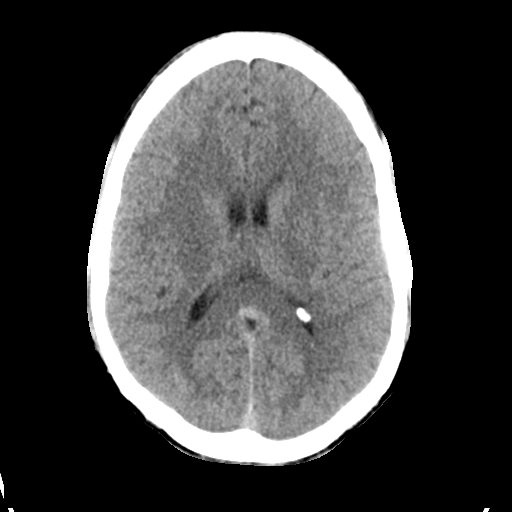
[im 17/31  bone]
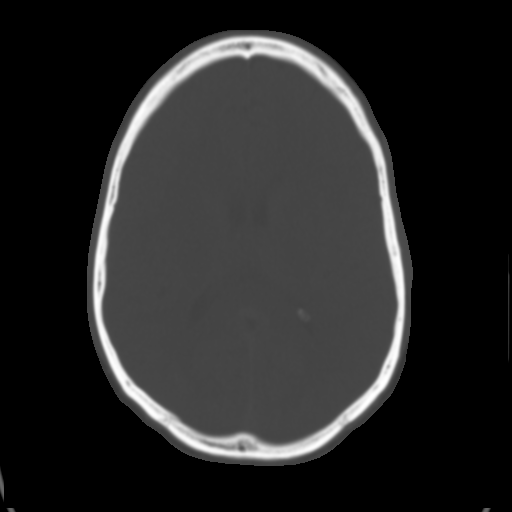
[im 21/31  brain]
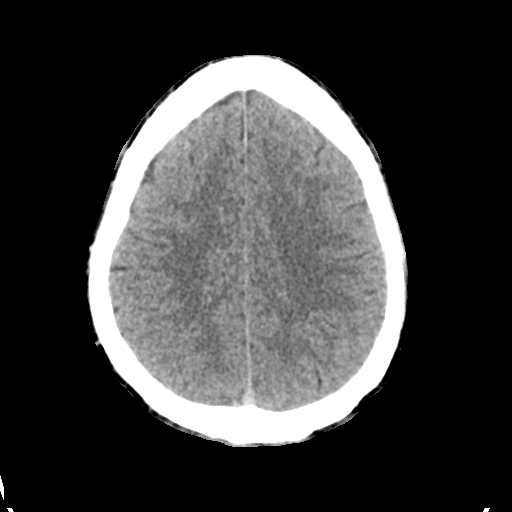
[im 24/31  brain]
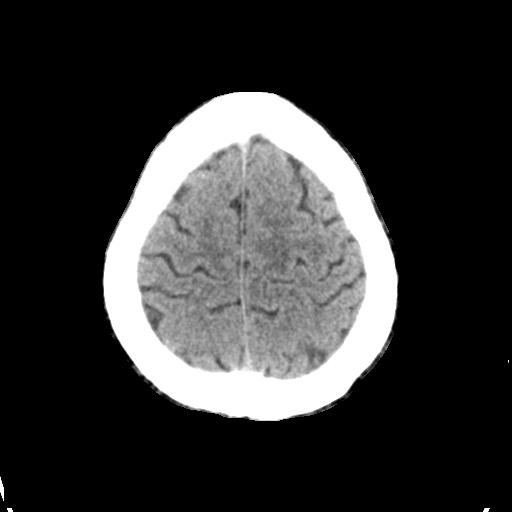
[im 28/31  brain]
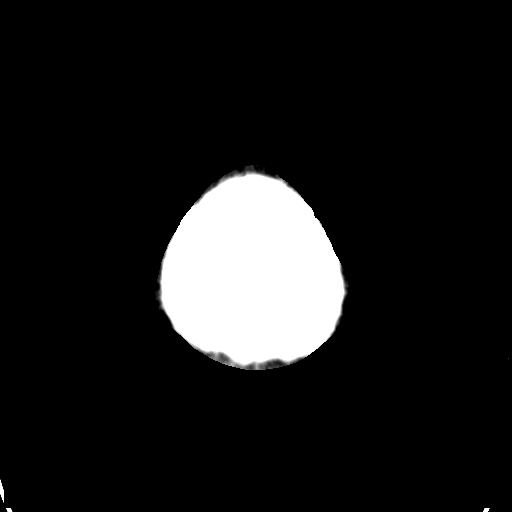

[Series 5: head 3.0 mpr cor · coronal · 0.30mm/px · 3 of 67 slices shown]
[im 23/67  brain]
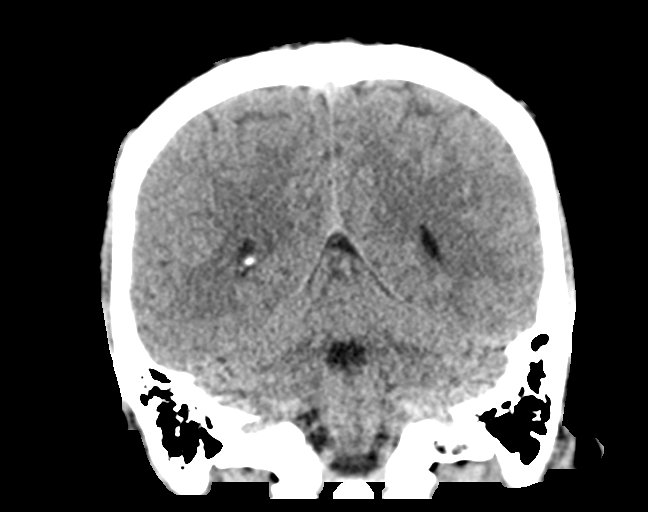
[im 30/67  brain]
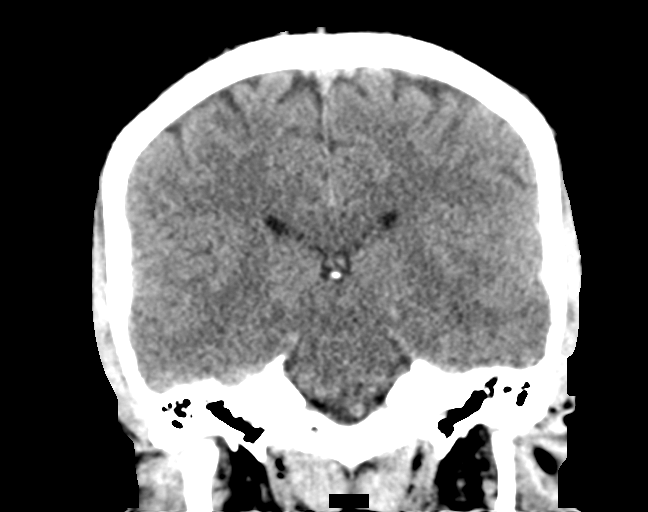
[im 37/67  brain]
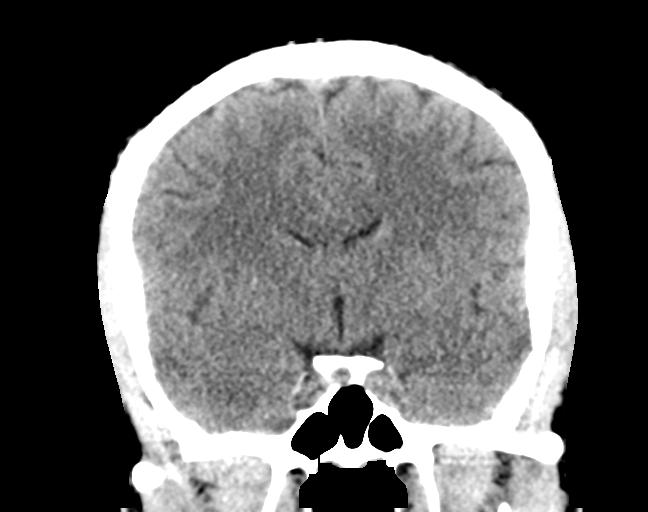

[Series 6: head 3.0 mpr sag · sagittal · 0.28mm/px · 3 of 58 slices shown]
[im 20/58  brain]
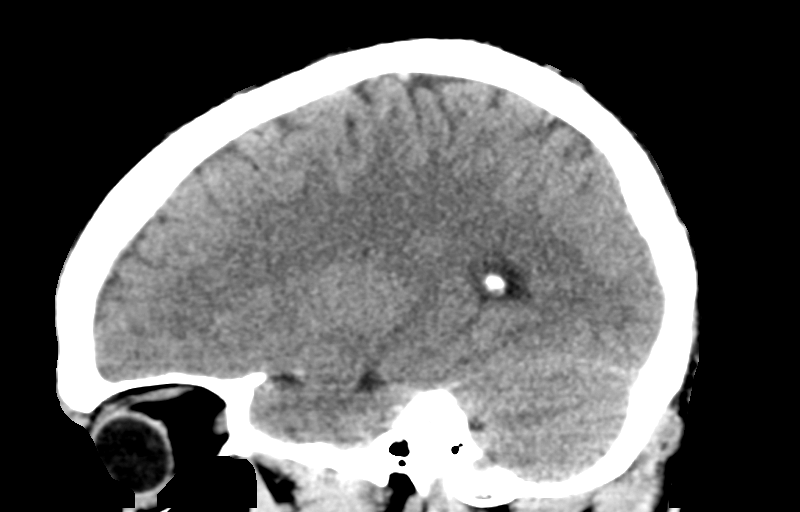
[im 29/58  brain]
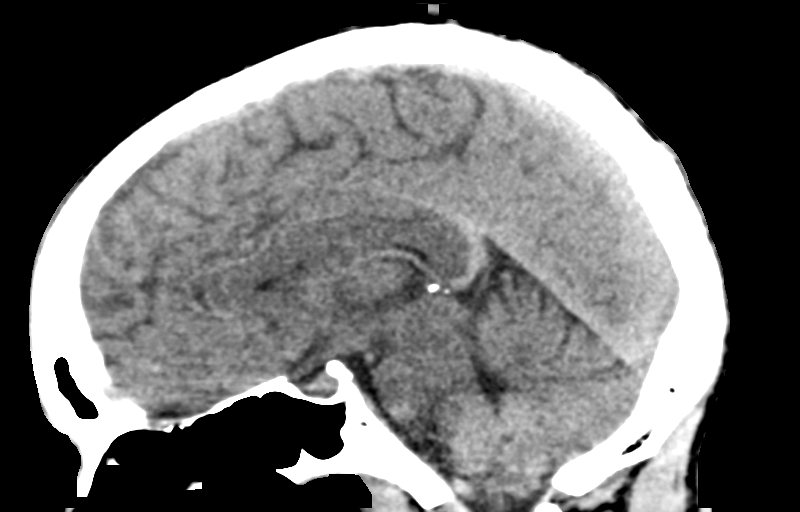
[im 39/58  brain]
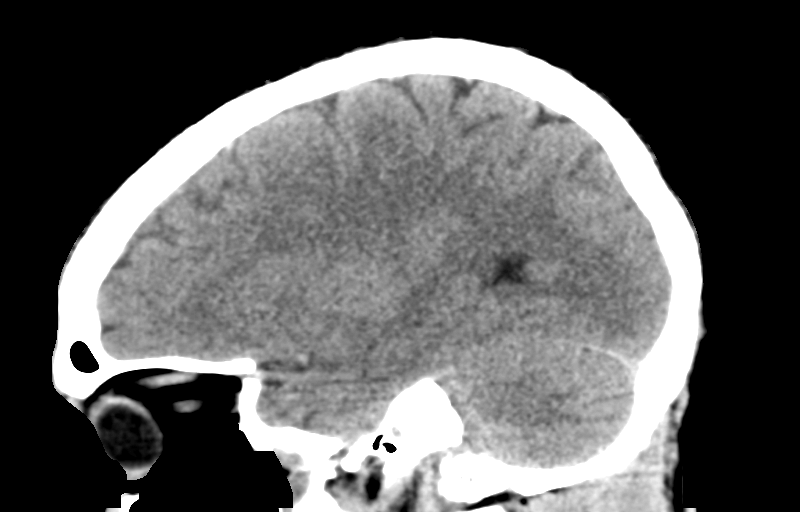

[14 of 47 positions shown; findings below may reference images not displayed]

FINDINGS: Brain: No evidence of acute infarction, hemorrhage, hydrocephalus,
extra-axial collection or mass lesion/mass effect.

Vascular: No hyperdense vessel or unexpected calcification.

Skull: Normal. Negative for fracture or focal lesion.

Sinuses/Orbits: Mucous retention cyst or polyp in the left sphenoid
sinus. Visualized sinuses are otherwise unremarkable. Orbits are
normal.

Other: None
IMPRESSION: No acute intracranial abnormality

## 2022-08-26 ENCOUNTER — Telehealth: Payer: Self-pay | Admitting: General Practice

## 2022-08-26 NOTE — Telephone Encounter (Signed)
Outbound call placed to schedule appointment to establish care. No answer/no voicemail. Left message with alt contact to request call back.

## 2022-10-29 ENCOUNTER — Ambulatory Visit (HOSPITAL_COMMUNITY)
Admission: EM | Admit: 2022-10-29 | Discharge: 2022-10-30 | Disposition: A | Discharge status: Home / Self Care | Payer: No Payment, Other | Attending: Registered Nurse | Admitting: Registered Nurse

## 2022-10-29 ENCOUNTER — Encounter (HOSPITAL_COMMUNITY): Payer: Self-pay | Admitting: Registered Nurse

## 2022-10-29 DIAGNOSIS — Z5900 Homelessness unspecified: Secondary | ICD-10-CM | POA: Diagnosis not present

## 2022-10-29 DIAGNOSIS — Z9151 Personal history of suicidal behavior: Secondary | ICD-10-CM | POA: Diagnosis not present

## 2022-10-29 DIAGNOSIS — F19959 Other psychoactive substance use, unspecified with psychoactive substance-induced psychotic disorder, unspecified: Secondary | ICD-10-CM | POA: Insufficient documentation

## 2022-10-29 LAB — CBC WITH DIFFERENTIAL/PLATELET
Abs Immature Granulocytes: 0.02 10*3/uL (ref 0.00–0.07)
Basophils Absolute: 0.1 10*3/uL (ref 0.0–0.1)
Basophils Relative: 1 %
Eosinophils Absolute: 0.3 10*3/uL (ref 0.0–0.5)
Eosinophils Relative: 6 %
HCT: 42.2 % (ref 39.0–52.0)
Hemoglobin: 13.9 g/dL (ref 13.0–17.0)
Immature Granulocytes: 0 %
Lymphocytes Relative: 29 %
Lymphs Abs: 1.4 10*3/uL (ref 0.7–4.0)
MCH: 27.9 pg (ref 26.0–34.0)
MCHC: 32.9 g/dL (ref 30.0–36.0)
MCV: 84.7 fL (ref 80.0–100.0)
Monocytes Absolute: 0.6 10*3/uL (ref 0.1–1.0)
Monocytes Relative: 11 %
Neutro Abs: 2.6 10*3/uL (ref 1.7–7.7)
Neutrophils Relative %: 53 %
Platelets: 199 10*3/uL (ref 150–400)
RBC: 4.98 MIL/uL (ref 4.22–5.81)
RDW: 14.6 % (ref 11.5–15.5)
WBC: 5 10*3/uL (ref 4.0–10.5)
nRBC: 0 % (ref 0.0–0.2)

## 2022-10-29 LAB — COMPREHENSIVE METABOLIC PANEL
ALT: 24 U/L (ref 0–44)
AST: 27 U/L (ref 15–41)
Albumin: 3.6 g/dL (ref 3.5–5.0)
Alkaline Phosphatase: 78 U/L (ref 38–126)
Anion gap: 11 (ref 5–15)
BUN: 7 mg/dL (ref 6–20)
CO2: 24 mmol/L (ref 22–32)
Calcium: 8.6 mg/dL — ABNORMAL LOW (ref 8.9–10.3)
Chloride: 103 mmol/L (ref 98–111)
Creatinine, Ser: 0.85 mg/dL (ref 0.61–1.24)
GFR, Estimated: >60 mL/min (ref >60)
Glucose, Bld: 90 mg/dL (ref 70–99)
Potassium: 3.1 mmol/L — ABNORMAL LOW (ref 3.5–5.1)
Sodium: 138 mmol/L (ref 135–145)
Total Bilirubin: 1.3 mg/dL — ABNORMAL HIGH (ref 0.3–1.2)
Total Protein: 6.2 g/dL — ABNORMAL LOW (ref 6.5–8.1)

## 2022-10-29 LAB — HEMOGLOBIN A1C
Hgb A1c MFr Bld: 5.4 % (ref 4.8–5.6)
Mean Plasma Glucose: 108.28 mg/dL

## 2022-10-29 LAB — URINALYSIS, ROUTINE W REFLEX MICROSCOPIC
Bilirubin Urine: NEGATIVE
Glucose, UA: NEGATIVE mg/dL
Hgb urine dipstick: NEGATIVE
Ketones, ur: NEGATIVE mg/dL
Leukocytes,Ua: NEGATIVE
Nitrite: NEGATIVE
Protein, ur: NEGATIVE mg/dL
Specific Gravity, Urine: 1.025 (ref 1.005–1.030)
pH: 5 (ref 5.0–8.0)

## 2022-10-29 LAB — LIPID PANEL
Cholesterol: 209 mg/dL — ABNORMAL HIGH (ref 0–200)
HDL: 84 mg/dL (ref >40)
LDL Cholesterol: 103 mg/dL — ABNORMAL HIGH (ref 0–99)
Total CHOL/HDL Ratio: 2.5 RATIO
Triglycerides: 110 mg/dL (ref <150)
VLDL: 22 mg/dL (ref 0–40)

## 2022-10-29 LAB — POCT URINE DRUG SCREEN - MANUAL ENTRY (I-SCREEN)
POC Amphetamine UR: NOT DETECTED
POC Buprenorphine (BUP): NOT DETECTED
POC Cocaine UR: POSITIVE — AB
POC Marijuana UR: POSITIVE — AB
POC Methadone UR: NOT DETECTED
POC Methamphetamine UR: NOT DETECTED
POC Morphine: NOT DETECTED
POC Oxazepam (BZO): NOT DETECTED
POC Oxycodone UR: NOT DETECTED
POC Secobarbital (BAR): NOT DETECTED

## 2022-10-29 LAB — MAGNESIUM: Magnesium: 2.2 mg/dL (ref 1.7–2.4)

## 2022-10-29 LAB — TSH: TSH: 0.41 u[IU]/mL (ref 0.350–4.500)

## 2022-10-29 LAB — ETHANOL: Alcohol, Ethyl (B): 49 mg/dL — ABNORMAL HIGH (ref <10)

## 2022-10-29 MED ORDER — HYDROXYZINE HCL 25 MG PO TABS: 25.0000 mg | ORAL_TABLET | Freq: Three times a day (TID) | ORAL | Status: DC | PRN

## 2022-10-29 MED ORDER — ACETAMINOPHEN 325 MG PO TABS: 650.0000 mg | ORAL_TABLET | Freq: Four times a day (QID) | ORAL | Status: DC | PRN

## 2022-10-29 MED ORDER — OLANZAPINE 5 MG PO TABS
5.0000 mg | ORAL_TABLET | Freq: Every day | ORAL | Status: DC
  Administered 2022-10-29: 5 mg via ORAL
  Filled 2022-10-29: qty 1
  Filled 2022-10-29: qty 7

## 2022-10-29 MED ORDER — TRAZODONE HCL 50 MG PO TABS: 50.0000 mg | ORAL_TABLET | Freq: Every evening | ORAL | Status: DC | PRN

## 2022-10-29 MED ORDER — MAGNESIUM HYDROXIDE 400 MG/5ML PO SUSP: 30.0000 mL | Freq: Every day | ORAL | Status: DC | PRN

## 2022-10-29 MED ORDER — ALUM & MAG HYDROXIDE-SIMETH 200-200-20 MG/5ML PO SUSP: 30.0000 mL | ORAL | Status: DC | PRN

## 2022-10-29 NOTE — ED Notes (Signed)
[facility] lab courier called to transport labs to [facility] lab 

## 2022-10-29 NOTE — ED Notes (Signed)
Patient  sleeping in no acute stress. RR even and unlabored .Environment secured .Will continue to monitor for safely. 

## 2022-10-29 NOTE — ED Notes (Signed)
Pt admitted to obs. endorsing SI/AVH. Denies hI . States that he would like some one to harm him. Calm, cooperative throughout interview process. Skin assessment completed. Oriented to unit. Meal and drink offered. At currrent, pt continue to endorse [facility]. Pt verbally contract for safety. Will monitor for safety. Denies HI

## 2022-10-29 NOTE — BH Assessment (Signed)
Comprehensive Clinical Assessment (CCA) Note   10/29/2022 Jack Castaneda [id number]  Disposition: Assunta Found, NP recommends continuous assessment. Pt will be reassessed in AM.   The patient demonstrates the following risk factors for suicide: Chronic risk factors for suicide include: substance use disorder. Acute risk factors for suicide include: social withdrawal/isolation. Protective factors for this patient include: hope for the future. Considering these factors, the overall suicide risk at this point appears to be low. Patient is not appropriate for outpatient follow up.  Pt is 42 yo male who was brought to [facility] voluntarily by [employer]. Pt reports that he is currently struggling with depression and he feels he doesn't belong anymore. Pt was tearful thoughout the assessment . Pt reports that that he is having passive SI thoughts but does not have a plan. Pt denies HI. Pt did not give a clear answer when asked about AVH. However, pt was observed responding to internal stimuli. Pt reports that he has hx of bipolar disorder. Pt admits to drug and alcohol use. Pt reports that he has been " partying" since 10/27/22 and today is his birthday. Pt reports using marijuana and drinking alcohol. The amount of drug and alcohol use is unknown. Pt does appear to be under the influence. Pt reports that he is not on any psychotropic medications. Pt currently does not see a therapist. Pt reports that he does not feel safe around other people. Pt does present paranoid at this times.  Pt reports that he is employed in [employer]. Pt reports that he is currently homeless. Pt reports that he is not getting any sleep and lacks an appetite.   Pt was casually dressed and groomed appropriately. Pt is alert, oriented x4 with delayed speech and normal motor behavior. Eye contact is good. Pt's mood is depressed, and affect is flat. Thought process is coherent and relevant. Pt's insight is fair and judgement is poor.  There was an indication pt is currently responding to internal stimuli or experiencing delusional thought content. Pt was cooperative throughout assessment .    Chief Complaint: Depression  Visit Diagnosis:  Bipolar  Substance Induced Mood D/O    CCA Screening, Triage and Referral (STR)  Patient Reported Information How did you hear about [place]? Legal System  What Is the Reason for Your Visit/Call Today? Pt is 42 yo male who was brought to [facility] voluntarily by [employer]. Pt reports that he is currently struggling with depression and he feels he doesn't belong anymore. Pt was tearful thoughout the assessment . Pt reports that that he is having passive SI thoughts but does not have a plan. Pt denies HI. Pt did not give a clear answer when asked about AVH. However, pt was observed responding to internal stimuli. Pt reports that he has hx of bipolar disorder. Pt admits to drug and alcohol use. Pt reports that he has been " partying" since 10/27/22 and today is his birthday. Pt reports using marijuana and drinking alcohol. The amount of drug and alcohol use is unknown. Pt does appear to be under the influence. Pt reports that he is not on any psychotropic medications. Pt currently does not see a therapist. Pt reports that he does not feel safe around other people. Pt does present paranoid at this times. Pt is homeless.  How Long Has This Been Causing You Problems? <Week  What Do You Feel Would Help You the Most Today? Treatment for Depression or other mood problem; Medication(s); Social Support   Have You  Recently Had Any Thoughts About Hurting Yourself? Yes  Are You Planning to Commit Suicide/Harm Yourself At This time? No   Flowsheet Row ED from 10/29/2022 in [facility] ED from 04/21/2019 in [facility] Emergency Department at [facility]  C-SSRS RISK CATEGORY No Risk No Risk       Have you Recently Had Thoughts About Hurting Someone Karolee Ohs? No Are You Planning  to Harm Someone at This Time? No  Explanation: Pt denies HI   Have You Used Any Alcohol or Drugs in the Past 24 Hours? Yes  What Did You Use and How Much? Alcohol and Marijuana. Amount unknown   Do You Currently Have a Therapist/Psychiatrist? No  Name of Therapist/Psychiatrist: Name of Therapist/Psychiatrist: n/a   Have You Been Recently Discharged From Any Office Practice or Programs? No  Explanation of Discharge From Practice/Program: n/a     CCA Screening Triage Referral Assessment Type of Contact: Face-to-Face  Telemedicine Service Delivery:   Is this Initial or Reassessment?   Date Telepsych consult ordered in [facility]:    Time Telepsych consult ordered in [facility]:    Location of Assessment: [facility] Assessment Services  Provider Location: [facility] Assessment Services   Collateral Involvement: none   Does Patient Have a Automotive engineer Guardian? No  Legal Guardian Contact Information: n/a  Copy of Legal Guardianship Form: -- (n/a)  Legal Guardian Notified of Arrival: -- (n/a)  Legal Guardian Notified of Pending Discharge: -- (n/a)  If Minor and Not Living with Parent(s), Who has Custody? n/a  Is CPS involved or ever been involved? Never  Is APS involved or ever been involved? Never   Patient Determined To Be At Risk for Harm To Self or Others Based on Review of Patient Reported Information or Presenting Complaint? No  Method: No Plan  Availability of Means: No access or NA  Intent: Vague intent or NA  Notification Required: No need or identified person  Additional Information for Danger to Others Potential: -- (n/a)  Additional Comments for Danger to Others Potential: n/a  Are There Guns or Other Weapons in Your Home? No  Types of Guns/Weapons: Pt denies access to guns/ weapons  Are These Weapons Safely Secured?                            Yes  Who Could Verify You Are Able To Have These Secured: Pt deneis access to guns/ weapons  Do You Have  any Outstanding Charges, Pending Court Dates, Parole/Probation? Pt denies any pending legal charges  Contacted To Inform of Risk of Harm To Self or Others: -- (n/a)    Does Patient Present under Involuntary Commitment? No    [place] of Residence: [place]   Patient Currently Receiving the Following Services: Not Receiving Services   Determination of Need: Urgent (48 hours)   Options For Referral: Inpatient Hospitalization     CCA Biopsychosocial Patient Reported Schizophrenia/Schizoaffective Diagnosis in Past: No   Strengths: Pt is willing to seek treatment.   Mental Health Symptoms Depression:   Change in energy/activity; Fatigue; Hopelessness; Tearfulness; Sleep (too much or little); Irritability   Duration of Depressive symptoms:  Duration of Depressive Symptoms: Less than two weeks   Mania:   None   Anxiety:    Difficulty concentrating; Worrying   Psychosis:   Hallucinations   Duration of Psychotic symptoms:  Duration of Psychotic Symptoms: Less than six months   Trauma:  None   Obsessions:   None   Compulsions:   None   Inattention:  None  Hyperactivity/Impulsivity:   None   Oppositional/Defiant Behaviors:   None   Emotional Irregularity:   None   Other Mood/Personality Symptoms:   none    Mental Status Exam Appearance and self-care  Stature:   Average   Weight:   Thin   Clothing:   Casual   Grooming:   Normal   Cosmetic use:   None   Posture/gait:   Normal   Motor activity:   Not Remarkable   Sensorium  Attention:   Normal   Concentration:   Normal   Orientation:   Time   Recall/memory:   Normal   Affect and Mood  Affect:   Depressed; Flat; Tearful   Mood:   Depressed; Hopeless; Worthless   Relating  Eye contact:   Normal   Facial expression:   Sad   Attitude toward examiner:   Cooperative; Guarded   Thought and Language  Speech flow:  Slow; Blocked   Thought content:   Appropriate  to Mood and Circumstances   Preoccupation:   None   Hallucinations:   Auditory   Organization:   Patent attorney of Knowledge:   Fair   Intelligence:   Average   Abstraction:   Normal   Judgement:   Fair   Dance movement psychotherapist:   Distorted   Insight:   Fair; Flashes of insight   Decision Making:   Confused   Social Functioning  Social Maturity:   Impulsive   Social Judgement:   Naive   Stress  Stressors:   Housing; Other (Comment) (Friendships)   Coping Ability:   Exhausted; Overwhelmed   Skill Deficits:   Decision making   Supports:   Support needed     Religion: Religion/Spirituality Are You A Religious Person?: No How Might This Affect Treatment?: n/a  Leisure/Recreation: Leisure / Recreation Do You Have Hobbies?: No  Exercise/Diet: Exercise/Diet Do You Exercise?: No Have You Gained or Lost A Significant Amount of Weight in the Past Six Months?: No Do You Follow a Special Diet?: No Do You Have Any Trouble Sleeping?: No   CCA Employment/Education Employment/Work Situation: Employment / Work Situation Employment Situation: Employed Work Stressors: n/a Patient's Job has Been Impacted by Current Illness: No Has Patient ever Been in Equities trader?: No  Education: Education Is Patient Currently Attending School?: No Last Grade Completed: 12 Did You Product manager?: No Did You Have An Individualized Education Program (IIEP): No Did You Have Any Difficulty At [employer]?: No Patient's Education Has Been Impacted by Current Illness: No   CCA Family/Childhood History Family and Relationship History: Family history Marital status: Single Does patient have children?:  (UTA)  Childhood History:  Childhood History By whom was/is the patient raised?: Both parents Did patient suffer any verbal/emotional/physical/sexual abuse as a child?: No Did patient suffer from severe childhood neglect?: No Has patient ever been  sexually abused/assaulted/raped as an adolescent or adult?: No Was the patient ever a victim of a crime or a disaster?: No Witnessed domestic violence?: No Has patient been affected by domestic violence as an adult?: No       CCA Substance Use Alcohol/Drug Use: Alcohol / Drug Use Pain Medications: See MAR Prescriptions: See MAR Over the Counter: See MAR History of alcohol / drug use?: Yes Longest period of sobriety (when/how long): UTA Negative Consequences of Use: Personal relationships, Financial Withdrawal Symptoms: None Substance #  1 Name of Substance 1: Alcohol 1 - Age of First Use: UTA 1 - Amount (size/oz): Unknown 1 - Frequency: Unknown 1 - Last Use / Amount: Yesterday Substance #2 Name of Substance 2: Marijuana 2 - Age of First Use: Unknown 2 - Amount (size/oz): unknown 2 - Frequency: Daily 2 - Last Use / Amount: unknown                     ASAM's:  Six Dimensions of Multidimensional Assessment  Dimension 1:  Acute Intoxication and/or Withdrawal Potential:      Dimension 2:  Biomedical Conditions and Complications:      Dimension 3:  Emotional, Behavioral, or Cognitive Conditions and Complications:     Dimension 4:  Readiness to Change:     Dimension 5:  Relapse, Continued use, or Continued Problem Potential:     Dimension 6:  Recovery/Living Environment:     ASAM Severity Score:    ASAM Recommended Level of Treatment: ASAM Recommended Level of Treatment: Level II Intensive Outpatient Treatment   Substance use Disorder (SUD) Substance Use Disorder (SUD)  Checklist Symptoms of Substance Use: Continued use despite having a persistent/recurrent physical/psychological problem caused/exacerbated by use, Continued use despite persistent or recurrent social, interpersonal problems, caused or exacerbated by use  Recommendations for Services/Supports/Treatments: Recommendations for Services/Supports/Treatments Recommendations For  Services/Supports/Treatments: Other (Comment) ([facility] observation)  Discharge Disposition:    DSM5 Diagnoses: Patient Active Problem List   Diagnosis Date Noted   

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
## 2022-10-29 NOTE — ED Notes (Signed)
Pt is currently sleeping, no distress noted, environmental check complete, will continue to monitor patient for safety.  

## 2022-10-29 NOTE — ED Notes (Signed)
UDS was not done due to patient not being able to void. Advised patient to let  staff know when he can void. Patient also need ua

## 2022-10-30 LAB — PROLACTIN: Prolactin: 5.5 ng/mL (ref 3.9–22.7)

## 2022-10-30 MED ORDER — OLANZAPINE 5 MG PO TABS: 5.0000 mg | ORAL_TABLET | Freq: Every day | ORAL | Status: DC

## 2022-10-30 NOTE — ED Provider Notes (Signed)
FBC/OBS ASAP Discharge Summary  Date and Time: 10/30/2022 9:11 AM  Name: Jack Castaneda  MRN:  [id number]   Discharge Diagnoses:  Final diagnoses:  Psychoactive substance-induced psychosis (HCC)    Subjective: Jack Castaneda is a 42 year old male patient with a past psychiatric history reported as bipolar and schizophrenia. Per chart review, patient has a documented history of depression, and alcohol use disorder. Patient seen and evaluated face to face by this provider. On evaluation, patient is alert and oriented x 4. His thought process is linear and speech is clear and coherent. His mood is dysphoric and affect and is congruent. He is calm and cooperative and does not appear to be in acute distress. Today, patient denies SI, HI, and AVH. There is no objective evidence that the patient is currently responding to internal or external stimuli. He reports fair sleep but states that he had a couple dreams, "jump scares" last night that reminded him of bad experiences from the past. He reports a fair appetite. He describes his mood as having a breakdown yesterday due to a long time of putting up with everything. He was asked to elaborate, he further states, that he does not feel accepted by everybody. His responses are vague and he does not further elaborate. He denies outpatient psychiatry for the past 10 years but verbalizes wanting to get established with [facility] again for outpatient therapy and medication management. He states that he has been homeless for the past year because it was hard for him to keep stability with paying his part of the bills. 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 He states that he last worked on Thursday. He states that he does not use drugs often but was partying for a few days for his birthday. He reports using marijuana once a week and last use was on July 4th. He reports using cocaine once a month and last use was on July 4th. He reports  occasional alcohol use, "drink a little bit, a beer or two" per week. He reports last drinking alcohol on July 4th. He denies history of alcohol withdrawal, seizures or DTs. He denies physical complaints. He has been compliant with taking Zyprexa 5 mg po at bedtime. He denies medication side effect. I dicussed with the patient at length to continue taking the Zyprexa 5 mg po at bedtime x 7 days to reduce psychosis. I dicussed with the patient to refrain from using [facility] and cocaine as it can induce and worsen psychotic symptoms. I dicussed with the patient following up with [facility] tomorrow, on Monday 10/31/22 to establish outpatient services for medication management and therapy to address mental health concerns. I dicussed with the patient the [facility] for community resources for the homeless population. Patient states that he does not like staying at the  [facility] because they take away all of your personal stuff and he does not feel safe not having his belongings. I dicussed providing the patient with a list of shelter resources in the community. Safety planning completed with the patient. Patient discharged in stable condition.   Stay Summary: Per Morrie Sheldon, Triage Note, Pt is 42 yo male who was brought to [facility] voluntarily by [employer]. Pt reports that he is currently struggling with depression and he feels he doesn't belong anymore. Pt was tearful thoughout the assessment . Pt reports that that he is having passive SI thoughts but does not have a plan. Pt denies HI. Pt did not give a clear answer when  asked about AVH. However, pt was observed responding to internal stimuli. Pt reports that he has hx of bipolar disorder. Pt admits to drug and alcohol use. Pt reports that he has been " partying" since 10/27/22 and today is his birthday. Pt reports using marijuana and drinking alcohol. The amount of drug and alcohol use is unknown. Pt does appear to be under the influence. Pt reports that he is not on any psychotropic medications. Pt  currently does not see a therapist. Pt reports that he does not feel safe around other people. Pt does present paranoid at this times.    Total Time spent with patient: 20 minutes  Past Psychiatric History: Patient reports a history of bipolar and schizophrenia. Patient states that in the past he was prescribed fluphenazine and hydroxyzine. Patient reports outpatient psychiatry with [facility] in the past. Patient reports 1 inpatient psychiatric hospitalization at [facility] in 2015.   Past Medical History: None.  Family History: No known family history.   Family Psychiatric History: No known history.   Social History: Employed part-time. Homeless. Reports occasional drugs use (cocaine, THC). Reports occasional alcohol use.   Tobacco Cessation:  Prescription not provided because: declined  Current Medications:  Current Facility-Administered Medications  Medication Dose Route Frequency Provider Last Rate Last Admin   acetaminophen (TYLENOL) tablet 650 mg  650 mg Oral Q6H PRN Rankin, Shuvon B, NP       alum & mag hydroxide-simeth (MAALOX/MYLANTA) 200-200-20 MG/5ML suspension 30 mL  30 mL Oral Q4H PRN Rankin, Shuvon B, NP       hydrOXYzine (ATARAX) tablet 25 mg  25 mg Oral TID PRN Rankin, Shuvon B, NP       magnesium hydroxide (MILK OF MAGNESIA) suspension 30 mL  30 mL Oral Daily PRN Rankin, Shuvon B, NP       OLANZapine (ZYPREXA) tablet 5 mg  5 mg Oral QHS Rankin, Shuvon B, NP   5 mg at 10/29/22 2144   traZODone (DESYREL) tablet 50 mg  50 mg Oral QHS PRN Rankin, Shuvon B, NP       Current Outpatient Medications  Medication Sig Dispense Refill   OLANZapine (ZYPREXA) 5 MG tablet Take 1 tablet (5 mg total) by mouth at bedtime for 7 days.      PTA Medications:  Facility Ordered Medications  Medication   acetaminophen (TYLENOL) tablet 650 mg   alum & mag hydroxide-simeth (MAALOX/MYLANTA) 200-200-20 MG/5ML suspension 30 mL   magnesium hydroxide (MILK OF MAGNESIA) suspension 30 mL   hydrOXYzine  (ATARAX) tablet 25 mg   traZODone (DESYREL) tablet 50 mg   OLANZapine (ZYPREXA) tablet 5 mg   PTA Medications  Medication Sig   OLANZapine (ZYPREXA) 5 MG tablet Take 1 tablet (5 mg total) by mouth at bedtime for 7 days.        No data to display          Flowsheet Row ED from 10/29/2022 in [facility] ED from 04/21/2019 in [facility] Emergency Department at [facility]  C-SSRS RISK CATEGORY Low Risk No Risk       Musculoskeletal  Strength & Muscle Tone: within normal limits Gait & Station: normal Patient leans: N/A  Psychiatric Specialty Exam  Presentation  General Appearance:  Appropriate for Environment  Eye Contact: Fair  Speech: Clear and Coherent  Speech Volume: Normal  Handedness: Right   Mood and Affect  Mood: Dysphoric  Affect: Congruent   Thought Process  Thought Processes: Coherent; Goal Directed  Descriptions of Associations:Intact  Orientation:Full (Time, Place and Person)  Thought Content:Logical  Diagnosis of Schizophrenia or Schizoaffective disorder in past: No    Hallucinations:Hallucinations: None Description of Auditory Hallucinations: He reports that he is hearing voices but only gives vague statements of what voices are saying " You can't hear them.  You attractive, and you are cute as hell."  Referring to counselor and Clinical research associate  Ideas of Reference:None  Suicidal Thoughts:Suicidal Thoughts: No SI Passive Intent and/or Plan: Without Intent; Without Plan ("I don't want to kill myself, I just don't want to be around people")  Homicidal Thoughts:Homicidal Thoughts: No   Sensorium  Memory: Immediate Fair; Recent Fair; Remote Fair  Judgment: Fair  Insight: Fair   Chartered certified accountant: Fair  Attention Span: Fair  Recall: [employer] of Knowledge: Fair  Language: Fair   Psychomotor Activity  Psychomotor Activity: Psychomotor Activity: Normal   Assets   Assets: Communication Skills; Desire for Improvement; Leisure Time; Physical Health; Resilience   Sleep  Sleep: Sleep: Fair Number of Hours of Sleep: 0 (States he hasn't slept last couple days)   Nutritional Assessment (For OBS and FBC admissions only) Has the patient had a weight loss or gain of 10 pounds or more in the last 3 months?: No Has the patient had a decrease in food intake/or appetite?: Yes Does the patient have dental problems?: No Does the patient have eating habits or behaviors that may be indicators of an eating disorder including binging or inducing vomiting?: No Has the patient recently lost weight without trying?: 0 Has the patient been eating poorly because of a decreased appetite?: 1 Malnutrition Screening Tool Score: 1    Physical Exam  Physical Exam HENT:     Nose: Nose normal.  Cardiovascular:     Rate and Rhythm: Bradycardia present.  Pulmonary:     Effort: Pulmonary effort is normal.  Musculoskeletal:        General: Normal range of motion.     Cervical back: Normal range of motion.  Neurological:     Mental Status: He is alert and oriented to person, place, and time.    Review of Systems  Constitutional: Negative.   HENT: Negative.    Eyes: Negative.   Respiratory: Negative.    Cardiovascular: Negative.   Gastrointestinal: Negative.   Genitourinary: Negative.   Musculoskeletal: Negative.   Neurological: Negative.   Endo/Heme/Allergies: Negative.    Blood pressure 114/80, pulse (!) 51, temperature 97.7 F (36.5 C), temperature source Oral, resp. rate 17, SpO2 100 %. There is no height or weight on file to calculate BMI.  Demographic Factors:  Male and Low socioeconomic status  Loss Factors: Financial problems/change in socioeconomic status  Historical Factors: Prior suicide attempts  Risk Reduction Factors:   Sense of responsibility to family, Employed, and Positive coping skills or problem solving skills  Continued Clinical  Symptoms:  Alcohol/Substance Abuse/Dependencies Previous Psychiatric Diagnoses and Treatments  Cognitive Features That Contribute To Risk:  None    Suicide Risk:  Minimal: No identifiable suicidal ideation.  Patients presenting with no risk factors but with morbid ruminations; may be classified as minimal risk based on the severity of the depressive symptoms  Plan Of Care/Follow-up recommendations:    Medications: Patient is to take medications as prescribed. The patient or patient's guardian is to contact a medical professional and/or outpatient provider to address any new side effects that develop. The patient or the patient's guardian should update outpatient providers of any new medications  and/or medication changes.   Take Zyprexa 5 mg po at bedtime for psychosis x 7 days. Will provide a 7 day supply of sample medications.    Outpatient Follow up: Please review list of outpatient resources for psychiatry and counseling. Please follow up with your primary care provider for all medical related needs.   Therapy: We recommend that patient participate in individual therapy to address mental health concerns.  Atypical antipsychotics: If you are prescribed an atypical antipsychotic, it is recommended that your height, weight, BMI, blood pressure, fasting lipid panel, and fasting blood sugar be monitored by your outpatient providers.  Safety:   The following safety precautions should be taken:   No sharp objects. This includes scissors, razors, scrapers, and putty knives.   Chemicals should be removed and locked up.   Medications should be removed and locked up.   Weapons should be removed and locked up. This includes firearms, knives and instruments that can be used to cause injury.   The patient should abstain from use of illicit substances/drugs and abuse of any medications.  If symptoms worsen or do not continue to improve or if the patient becomes actively suicidal or homicidal  then it is recommended that the patient return to the closest hospital emergency department, the [facility], or call 911 for further evaluation and treatment. National Suicide Prevention Lifeline 1-800-SUICIDE or (727)005-1563.  About 988 988 offers 24/7 access to trained crisis counselors who can help people experiencing mental health-related distress. People can call or text 988 or chat 988lifeline.org for themselves or if they are worried about a loved one who may need crisis support.   Please contact one of the following facilities to start medication management and therapy services:   [facility] at [facility] [address] ([address] [place])  [place], [place]  [address] Phone: [phone]  [facility]  [address] [place], [place] [address] Phone: [phone]  [facility]  [address]  [place], [place] [address]  [facility]  [address]  [place], [place] [address] Phone: [phone]  Disposition: Discharge home.   Jahniah Pallas L, NP 10/30/2022, 9:11 AM

## 2022-10-30 NOTE — Discharge Instructions (Addendum)
Discharge recommendations:   Medications: Patient is to take medications as prescribed. The patient or patient's guardian is to contact a medical professional and/or outpatient provider to address any new side effects that develop. The patient or the patient's guardian should update outpatient providers of any new medications and/or medication changes.   Outpatient Follow up: Please review list of outpatient resources for psychiatry and counseling. Please follow up with your primary care provider for all medical related needs.   Therapy: We recommend that patient participate in individual therapy to address mental health concerns.  Atypical antipsychotics: If you are prescribed an atypical antipsychotic, it is recommended that your height, weight, BMI, blood pressure, fasting lipid panel, and fasting blood sugar be monitored by your outpatient providers.  Safety:   The following safety precautions should be taken:   No sharp objects. This includes scissors, razors, scrapers, and putty knives.   Chemicals should be removed and locked up.   Medications should be removed and locked up.   Weapons should be removed and locked up. This includes firearms, knives and instruments that can be used to cause injury.   The patient should abstain from use of illicit substances/drugs and abuse of any medications.  If symptoms worsen or do not continue to improve or if the patient becomes actively suicidal or homicidal then it is recommended that the patient return to the closest hospital emergency department, the [facility], or call 911 for further evaluation and treatment. National Suicide Prevention Lifeline 1-800-SUICIDE or 1-800-273-8255.  About 988 988 offers 24/7 access to trained crisis counselors who can help people experiencing mental health-related distress. People can call or text 988 or chat 988lifeline.org for themselves or if they are worried about a loved one  who may need crisis support.   Please contact one of the following facilities to start medication management and therapy services:   [facility] [place] [address] (SECOND FLOOR)  [place], [place]  [address] Phone: [phone]  [facility]  [address] [place], [place] [address] Phone: [phone]  [facility] [place]  [address]  [place], [place] [address]  [facility] [place]  [address]  [place], [place] [address] Phone: [phone]   

## 2022-10-30 NOTE — ED Notes (Signed)
Patient Is discharging at this time. Printed AVS reviewed with patient along with follow up appointments and resources. Patient denies SI, HI, and A/V/H. Valuables/belongings returned to patient. Patient provided with 7 day supply of medication and a bus pass. No s/s of current distress.

## 2022-10-30 NOTE — ED Notes (Signed)
Patient observed calmly resting. Even rise and fall of chest. No s/s of current distress.

## 2022-11-26 ENCOUNTER — Other Ambulatory Visit: Payer: Self-pay

## 2022-11-26 ENCOUNTER — Emergency Department (HOSPITAL_COMMUNITY)
Admission: EM | Admit: 2022-11-26 | Discharge: 2022-11-26 | Disposition: A | Discharge status: Home / Self Care | Payer: Self-pay | Attending: Emergency Medicine | Admitting: Emergency Medicine

## 2022-11-26 ENCOUNTER — Emergency Department (HOSPITAL_COMMUNITY): Payer: Self-pay

## 2022-11-26 DIAGNOSIS — Z79899 Other long term (current) drug therapy: Secondary | ICD-10-CM | POA: Insufficient documentation

## 2022-11-26 DIAGNOSIS — R000 Tachycardia, unspecified: Secondary | ICD-10-CM | POA: Insufficient documentation

## 2022-11-26 DIAGNOSIS — N179 Acute kidney failure, unspecified: Secondary | ICD-10-CM | POA: Insufficient documentation

## 2022-11-26 DIAGNOSIS — S0081XA Abrasion of other part of head, initial encounter: Secondary | ICD-10-CM | POA: Insufficient documentation

## 2022-11-26 DIAGNOSIS — S21339A Puncture wound without foreign body of unspecified front wall of thorax with penetration into thoracic cavity, initial encounter: Secondary | ICD-10-CM | POA: Insufficient documentation

## 2022-11-26 DIAGNOSIS — T07XXXA Unspecified multiple injuries, initial encounter: Secondary | ICD-10-CM

## 2022-11-26 DIAGNOSIS — F10920 Alcohol use, unspecified with intoxication, uncomplicated: Secondary | ICD-10-CM

## 2022-11-26 DIAGNOSIS — F10220 Alcohol dependence with intoxication, uncomplicated: Secondary | ICD-10-CM | POA: Insufficient documentation

## 2022-11-26 DIAGNOSIS — Y907 Blood alcohol level of 200-239 mg/100 ml: Secondary | ICD-10-CM | POA: Insufficient documentation

## 2022-11-26 DIAGNOSIS — Z23 Encounter for immunization: Secondary | ICD-10-CM | POA: Insufficient documentation

## 2022-11-26 DIAGNOSIS — X58XXXA Exposure to other specified factors, initial encounter: Secondary | ICD-10-CM | POA: Insufficient documentation

## 2022-11-26 DIAGNOSIS — S51811A Laceration without foreign body of right forearm, initial encounter: Secondary | ICD-10-CM | POA: Insufficient documentation

## 2022-11-26 LAB — CBC
HCT: 44.6 % (ref 39.0–52.0)
Hemoglobin: 14.4 g/dL (ref 13.0–17.0)
MCH: 28 pg (ref 26.0–34.0)
MCHC: 32.3 g/dL (ref 30.0–36.0)
MCV: 86.6 fL (ref 80.0–100.0)
Platelets: 232 10*3/uL (ref 150–400)
RBC: 5.15 MIL/uL (ref 4.22–5.81)
RDW: 14 % (ref 11.5–15.5)
WBC: 5.3 10*3/uL (ref 4.0–10.5)
nRBC: 0 % (ref 0.0–0.2)

## 2022-11-26 LAB — ETHANOL: Alcohol, Ethyl (B): 201 mg/dL — ABNORMAL HIGH (ref <10)

## 2022-11-26 LAB — I-STAT CHEM 8, ED
BUN: 7 mg/dL (ref 6–20)
Calcium, Ion: 1.01 mmol/L — ABNORMAL LOW (ref 1.15–1.40)
Chloride: 102 mmol/L (ref 98–111)
Creatinine, Ser: 1.5 mg/dL — ABNORMAL HIGH (ref 0.61–1.24)
Glucose, Bld: 173 mg/dL — ABNORMAL HIGH (ref 70–99)
HCT: 46 % (ref 39.0–52.0)
Hemoglobin: 15.6 g/dL (ref 13.0–17.0)
Potassium: 3.4 mmol/L — ABNORMAL LOW (ref 3.5–5.1)
Sodium: 138 mmol/L (ref 135–145)
TCO2: 18 mmol/L — ABNORMAL LOW (ref 22–32)

## 2022-11-26 LAB — COMPREHENSIVE METABOLIC PANEL
ALT: 24 U/L (ref 0–44)
AST: 24 U/L (ref 15–41)
Albumin: 4.2 g/dL (ref 3.5–5.0)
Alkaline Phosphatase: 85 U/L (ref 38–126)
Anion gap: 23 — ABNORMAL HIGH (ref 5–15)
BUN: 7 mg/dL (ref 6–20)
CO2: 17 mmol/L — ABNORMAL LOW (ref 22–32)
Calcium: 9.1 mg/dL (ref 8.9–10.3)
Chloride: 98 mmol/L (ref 98–111)
Creatinine, Ser: 1.3 mg/dL — ABNORMAL HIGH (ref 0.61–1.24)
GFR, Estimated: >60 mL/min (ref >60)
Glucose, Bld: 169 mg/dL — ABNORMAL HIGH (ref 70–99)
Potassium: 3.4 mmol/L — ABNORMAL LOW (ref 3.5–5.1)
Sodium: 138 mmol/L (ref 135–145)
Total Bilirubin: 1 mg/dL (ref 0.3–1.2)
Total Protein: 7.2 g/dL (ref 6.5–8.1)

## 2022-11-26 LAB — PROTIME-INR
INR: 1 (ref 0.8–1.2)
Prothrombin Time: 13.8 seconds (ref 11.4–15.2)

## 2022-11-26 LAB — I-STAT CG4 LACTIC ACID, ED: Lactic Acid, Venous: 8.1 mmol/L (ref 0.5–1.9)

## 2022-11-26 LAB — SAMPLE TO BLOOD BANK

## 2022-11-26 MED ORDER — LACTATED RINGERS IV BOLUS: 1000.0000 mL | Freq: Once | INTRAVENOUS | Status: DC

## 2022-11-26 MED ORDER — CEPHALEXIN 500 MG PO CAPS: 500.0000 mg | ORAL_CAPSULE | Freq: Two times a day (BID) | ORAL | 0 refills | Status: DC

## 2022-11-26 MED ORDER — SODIUM CHLORIDE 0.9 % IV BOLUS
1000.0000 mL | Freq: Once | INTRAVENOUS | Status: AC
  Administered 2022-11-26: 1000 mL via INTRAVENOUS

## 2022-11-26 MED ORDER — TETANUS-DIPHTH-ACELL PERTUSSIS 5-2.5-18.5 LF-MCG/0.5 IM SUSY
0.5000 mL | PREFILLED_SYRINGE | Freq: Once | INTRAMUSCULAR | Status: AC
  Administered 2022-11-26: 0.5 mL via INTRAMUSCULAR
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
## 2022-11-26 NOTE — ED Notes (Signed)
Pt ambulated without difficulty

## 2022-11-26 NOTE — Discharge Instructions (Signed)
The stitches will come out in 7-10days on their own.  Wash with soap but do not soak.  Also started on antibiotics to prevent infection.  Your tetanus shot was updated today.
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
## 2022-11-26 NOTE — Progress Notes (Signed)
Orthopedic Tech Progress Note Patient Details:  Jack Castaneda 27-Mar-1981 [id number]  Patient ID: Jack Castaneda, male   DOB: May 23, 1980, 42 y.o.   MRN: [id number] I attended trauma page. Trinna Post 11/26/2022, 9:49 PM
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
## 2023-07-21 ENCOUNTER — Inpatient Hospital Stay (HOSPITAL_COMMUNITY)
Admission: AD | Admit: 2023-07-21 | Discharge: 2023-07-25 | DRG: 885 | Disposition: A | Discharge status: Home / Self Care | Source: Intra-hospital | Attending: Psychiatry | Admitting: Psychiatry

## 2023-07-21 ENCOUNTER — Other Ambulatory Visit: Payer: Self-pay

## 2023-07-21 ENCOUNTER — Emergency Department (HOSPITAL_COMMUNITY)
Admission: EM | Admit: 2023-07-21 | Discharge: 2023-07-21 | Disposition: A | Discharge status: Other Acute Inpatient Hospital | Payer: Self-pay | Attending: Emergency Medicine | Admitting: Emergency Medicine

## 2023-07-21 DIAGNOSIS — Y906 Blood alcohol level of 120-199 mg/100 ml: Secondary | ICD-10-CM | POA: Insufficient documentation

## 2023-07-21 DIAGNOSIS — F1090 Alcohol use, unspecified, uncomplicated: Secondary | ICD-10-CM | POA: Insufficient documentation

## 2023-07-21 DIAGNOSIS — Z79899 Other long term (current) drug therapy: Secondary | ICD-10-CM

## 2023-07-21 DIAGNOSIS — F32A Depression, unspecified: Secondary | ICD-10-CM

## 2023-07-21 DIAGNOSIS — R45851 Suicidal ideations: Secondary | ICD-10-CM | POA: Diagnosis present

## 2023-07-21 DIAGNOSIS — F1994 Other psychoactive substance use, unspecified with psychoactive substance-induced mood disorder: Secondary | ICD-10-CM | POA: Diagnosis present

## 2023-07-21 DIAGNOSIS — Z5900 Homelessness unspecified: Secondary | ICD-10-CM

## 2023-07-21 DIAGNOSIS — F419 Anxiety disorder, unspecified: Secondary | ICD-10-CM | POA: Diagnosis present

## 2023-07-21 DIAGNOSIS — F1490 Cocaine use, unspecified, uncomplicated: Secondary | ICD-10-CM | POA: Diagnosis present

## 2023-07-21 DIAGNOSIS — F17210 Nicotine dependence, cigarettes, uncomplicated: Secondary | ICD-10-CM | POA: Diagnosis present

## 2023-07-21 DIAGNOSIS — F1210 Cannabis abuse, uncomplicated: Secondary | ICD-10-CM | POA: Insufficient documentation

## 2023-07-21 DIAGNOSIS — F1910 Other psychoactive substance abuse, uncomplicated: Principal | ICD-10-CM | POA: Diagnosis present

## 2023-07-21 DIAGNOSIS — Z1152 Encounter for screening for COVID-19: Secondary | ICD-10-CM

## 2023-07-21 DIAGNOSIS — F29 Unspecified psychosis not due to a substance or known physiological condition: Secondary | ICD-10-CM | POA: Insufficient documentation

## 2023-07-21 DIAGNOSIS — I10 Essential (primary) hypertension: Secondary | ICD-10-CM | POA: Diagnosis present

## 2023-07-21 DIAGNOSIS — F10129 Alcohol abuse with intoxication, unspecified: Secondary | ICD-10-CM | POA: Diagnosis present

## 2023-07-21 DIAGNOSIS — Z9151 Personal history of suicidal behavior: Secondary | ICD-10-CM | POA: Diagnosis not present

## 2023-07-21 DIAGNOSIS — F319 Bipolar disorder, unspecified: Principal | ICD-10-CM | POA: Diagnosis present

## 2023-07-21 DIAGNOSIS — F3162 Bipolar disorder, current episode mixed, moderate: Secondary | ICD-10-CM | POA: Diagnosis not present

## 2023-07-21 LAB — RAPID URINE DRUG SCREEN, HOSP PERFORMED
Amphetamines: NOT DETECTED
Barbiturates: NOT DETECTED
Benzodiazepines: NOT DETECTED
Cocaine: POSITIVE — AB
Opiates: NOT DETECTED
Tetrahydrocannabinol: POSITIVE — AB

## 2023-07-21 LAB — CBC
HCT: 42.7 % (ref 39.0–52.0)
Hemoglobin: 14.2 g/dL (ref 13.0–17.0)
MCH: 28.5 pg (ref 26.0–34.0)
MCHC: 33.3 g/dL (ref 30.0–36.0)
MCV: 85.7 fL (ref 80.0–100.0)
Platelets: 230 10*3/uL (ref 150–400)
RBC: 4.98 MIL/uL (ref 4.22–5.81)
RDW: 14 % (ref 11.5–15.5)
WBC: 13.5 10*3/uL — ABNORMAL HIGH (ref 4.0–10.5)
nRBC: 0 % (ref 0.0–0.2)

## 2023-07-21 LAB — ETHANOL: Alcohol, Ethyl (B): 132 mg/dL — ABNORMAL HIGH (ref <10)

## 2023-07-21 LAB — COMPREHENSIVE METABOLIC PANEL WITH GFR
ALT: 34 U/L (ref 0–44)
AST: 67 U/L — ABNORMAL HIGH (ref 15–41)
Albumin: 4.2 g/dL (ref 3.5–5.0)
Alkaline Phosphatase: 82 U/L (ref 38–126)
Anion gap: 17 — ABNORMAL HIGH (ref 5–15)
BUN: 11 mg/dL (ref 6–20)
CO2: 23 mmol/L (ref 22–32)
Calcium: 9 mg/dL (ref 8.9–10.3)
Chloride: 100 mmol/L (ref 98–111)
Creatinine, Ser: 1.04 mg/dL (ref 0.61–1.24)
GFR, Estimated: >60 mL/min (ref >60)
Glucose, Bld: 191 mg/dL — ABNORMAL HIGH (ref 70–99)
Potassium: 3.4 mmol/L — ABNORMAL LOW (ref 3.5–5.1)
Sodium: 140 mmol/L (ref 135–145)
Total Bilirubin: 1.5 mg/dL — ABNORMAL HIGH (ref 0.0–1.2)
Total Protein: 7.3 g/dL (ref 6.5–8.1)

## 2023-07-21 LAB — RESP PANEL BY RT-PCR (RSV, FLU A&B, COVID)  RVPGX2
Influenza A by PCR: NEGATIVE
Influenza B by PCR: NEGATIVE
Resp Syncytial Virus by PCR: NEGATIVE
SARS Coronavirus 2 by RT PCR: NEGATIVE

## 2023-07-21 MED ORDER — NICOTINE 21 MG/24HR TD PT24
21.0000 mg | MEDICATED_PATCH | Freq: Every day | TRANSDERMAL | Status: DC
  Filled 2023-07-21 (×4): qty 1

## 2023-07-21 MED ORDER — DIPHENHYDRAMINE HCL 50 MG/ML IJ SOLN: 50.0000 mg | Freq: Three times a day (TID) | INTRAMUSCULAR | Status: DC | PRN

## 2023-07-21 MED ORDER — LORAZEPAM 2 MG/ML IJ SOLN: 2.0000 mg | Freq: Three times a day (TID) | INTRAMUSCULAR | Status: DC | PRN

## 2023-07-21 MED ORDER — ALUM & MAG HYDROXIDE-SIMETH 200-200-20 MG/5ML PO SUSP: 30.0000 mL | ORAL | Status: DC | PRN

## 2023-07-21 MED ORDER — ZOLPIDEM TARTRATE 5 MG PO TABS: 5.0000 mg | ORAL_TABLET | Freq: Every evening | ORAL | Status: DC | PRN

## 2023-07-21 MED ORDER — HALOPERIDOL LACTATE 5 MG/ML IJ SOLN: 10.0000 mg | Freq: Three times a day (TID) | INTRAMUSCULAR | Status: DC | PRN

## 2023-07-21 MED ORDER — DIPHENHYDRAMINE HCL 25 MG PO CAPS: 50.0000 mg | ORAL_CAPSULE | Freq: Three times a day (TID) | ORAL | Status: DC | PRN

## 2023-07-21 MED ORDER — HALOPERIDOL 5 MG PO TABS: 5.0000 mg | ORAL_TABLET | Freq: Three times a day (TID) | ORAL | Status: DC | PRN

## 2023-07-21 MED ORDER — HYDROXYZINE HCL 25 MG PO TABS: 25.0000 mg | ORAL_TABLET | Freq: Three times a day (TID) | ORAL | Status: DC | PRN

## 2023-07-21 MED ORDER — NICOTINE 21 MG/24HR TD PT24: 21.0000 mg | MEDICATED_PATCH | Freq: Every day | TRANSDERMAL | Status: DC

## 2023-07-21 MED ORDER — ALUM & MAG HYDROXIDE-SIMETH 200-200-20 MG/5ML PO SUSP: 30.0000 mL | Freq: Four times a day (QID) | ORAL | Status: DC | PRN

## 2023-07-21 MED ORDER — ACETAMINOPHEN 325 MG PO TABS: 650.0000 mg | ORAL_TABLET | ORAL | Status: DC | PRN

## 2023-07-21 MED ORDER — ONDANSETRON HCL 4 MG PO TABS: 4.0000 mg | ORAL_TABLET | Freq: Three times a day (TID) | ORAL | Status: DC | PRN

## 2023-07-21 MED ORDER — HYDROXYZINE HCL 25 MG PO TABS
25.0000 mg | ORAL_TABLET | Freq: Three times a day (TID) | ORAL | Status: DC | PRN
  Administered 2023-07-23 – 2023-07-24 (×2): 25 mg via ORAL
  Filled 2023-07-21 (×3): qty 1

## 2023-07-21 MED ORDER — HALOPERIDOL LACTATE 5 MG/ML IJ SOLN: 5.0000 mg | Freq: Three times a day (TID) | INTRAMUSCULAR | Status: DC | PRN

## 2023-07-21 MED ORDER — MAGNESIUM HYDROXIDE 400 MG/5ML PO SUSP: 30.0000 mL | Freq: Every day | ORAL | Status: DC | PRN

## 2023-07-21 MED ORDER — ARIPIPRAZOLE 10 MG PO TABS
10.0000 mg | ORAL_TABLET | Freq: Every day | ORAL | Status: DC
  Administered 2023-07-22 – 2023-07-25 (×4): 10 mg via ORAL
  Filled 2023-07-21 (×5): qty 1

## 2023-07-21 MED ORDER — ACETAMINOPHEN 325 MG PO TABS: 650.0000 mg | ORAL_TABLET | Freq: Four times a day (QID) | ORAL | Status: DC | PRN

## 2023-07-21 MED ORDER — ARIPIPRAZOLE 10 MG PO TABS
10.0000 mg | ORAL_TABLET | Freq: Every day | ORAL | Status: DC
  Administered 2023-07-21: 10 mg via ORAL
  Filled 2023-07-21: qty 1

## 2023-07-21 NOTE — Progress Notes (Signed)
 Pt accepted to [facility] Room 301-2. Attending Dr. Sherron Flemings. Room ready now. Please call report to [phone]

## 2023-07-21 NOTE — ED Notes (Signed)
 Pt dressed out in  [facility] scrubs

## 2023-07-21 NOTE — ED Notes (Signed)
 Please update wife [phone]

## 2023-07-21 NOTE — ED Notes (Signed)
 Report given to [facility] nurse Alexa D'Addio, safe ride called to transport pt.

## 2023-07-21 NOTE — BH Assessment (Signed)
 Comprehensive Clinical Assessment (CCA) Note   07/21/2023 Jack Castaneda [id number]  Disposition: Rockney Ghee, NP recommends inpatient hospitalization.   The patient demonstrates the following risk factors for suicide: Chronic risk factors for suicide include: psychiatric disorder of Bipolar . Acute risk factors for suicide include: family or marital conflict and social withdrawal/isolation. Protective factors for this patient include: positive social support. Considering these factors, the overall suicide risk at this point appears to be low. Patient is not appropriate for outpatient follow up.   Per EDP's note: "  Pt is a 43 yo M w/ h/o depression/anxiety here with worsening depression and suicidal thoughts without a concrete plan. Some reported manic behavior in triage. Tearful on exam today. Denies recent medical problems or complaints. Has been on antidepressants in the past but not currently. 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  Pt reports that he is currently homeless. Pt reports that he is not getting any sleep and lacks an appetite.    Pt was casually dressed and groomed appropriately. Pt is alert, oriented x4 with delayed speech and normal motor behavior. Eye contact is good. Pt's mood is depressed, and affect is flat. Thought process is coherent and relevant. Pt's insight is fair and judgement is poor. There was no indication pt is currently responding to internal stimuli or experiencing delusional thought content. Pt was cooperative throughout assessment .   Chief Complaint:  Chief Complaint  Patient presents with   Manic Behavior   Visit Diagnosis:  Bipolar     CCA Screening, Triage and Referral (STR)  Patient Reported Information How did you hear about [place]? -- [facility] ED)  What Is the Reason for Your Visit/Call Today? Per EDP's note: "  Pt is a 43 yo M w/ h/o  depression/anxiety here with worsening depression and suicidal thoughts without a concrete plan. Some reported manic behavior in triage. Tearful on exam today. Denies recent medical problems or complaints. Has been on antidepressants in the past but not currently. Does self medicate with alcohol and some nondisclosed drugs."  How Long Has This Been Causing You Problems? > than 6 months  What Do You Feel Would Help You the Most Today? Treatment for Depression or other mood problem; Medication(s)   Have You Recently Had Any Thoughts About Hurting Yourself? Yes  Are You Planning to Commit Suicide/Harm Yourself At This time? No   Flowsheet Row ED from 07/21/2023 in [facility] Emergency Department at [facility] ED from 10/29/2022 in [facility] ED from 04/21/2019 in [facility] Emergency Department at [facility]  C-SSRS RISK CATEGORY Low Risk Low Risk No Risk       Have you Recently Had Thoughts About Hurting Someone Karolee Ohs? No  Are You Planning to Harm Someone at This Time? No  Explanation: Denies HI   Have You Used Any Alcohol or Drugs in the Past 24 Hours? Yes  How Long Ago Did You Use Drugs or Alcohol? "last night"  What Did You Use and How Much? "I drank 1/2 pint of whiskey"   Do You Currently Have a Therapist/Psychiatrist? No  Name of Therapist/Psychiatrist:    Have You Been Recently Discharged From Any Office Practice or Programs? No  Explanation of Discharge From Practice/Program: n/a     CCA Screening Triage Referral Assessment Type of Contact: Tele-Assessment  Telemedicine Service Delivery: Telemedicine service delivery:  This service was provided via telemedicine using a 2-way, interactive audio and video technology  Is this Initial or Reassessment? Is this Initial or Reassessment?: Initial Assessment  Date Telepsych consult ordered in [facility]:  Date Telepsych consult ordered in [facility]: 07/21/23  Time Telepsych consult  ordered in [facility]:  Time Telepsych consult ordered in [facility]: 0434  Location of Assessment: [facility] ED  Provider Location: [facility] Assessment Services   Collateral Involvement: none   Does Patient Have a Automotive engineer Guardian? No  Legal Guardian Contact Information: n/a  Copy of Legal Guardianship Form: -- (n/a)  Legal Guardian Notified of Arrival: -- (n/a)  Legal Guardian Notified of Pending Discharge: -- (n/a)  If Minor and Not Living with Parent(s), Who has Custody? n/a  Is CPS involved or ever been involved? Never  Is APS involved or ever been involved? Never   Patient Determined To Be At Risk for Harm To Self or Others Based on Review of Patient Reported Information or Presenting Complaint? Yes, for Self-Harm  Method: No Plan  Availability of Means: No access or NA  Intent: Vague intent or NA  Notification Required: No need or identified person  Additional Information for Danger to Others Potential: -- (n/a)  Additional Comments for Danger to Others Potential: n/a  Are There Guns or Other Weapons in Your Home? No  Types of Guns/Weapons: Pt denies access to guns/ weapons  Are These Weapons Safely Secured?                            Yes  Who Could Verify You Are Able To Have These Secured: Pt deneis access to guns/ weapons  Do You Have any Outstanding Charges, Pending Court Dates, Parole/Probation? Denies pending legal charges  Contacted To Inform of Risk of Harm To Self or Others: -- (n/a)    Does Patient Present under Involuntary Commitment? No    [place] of Residence: [place]   Patient Currently Receiving the Following Services: Not Receiving Services   Determination of Need: Urgent (48 hours)   Options For Referral: Inpatient Hospitalization; Medication Management     CCA Biopsychosocial Patient Reported Schizophrenia/Schizoaffective Diagnosis in Past: Yes   Strengths: Pt is willing to seek treatment.   Mental Health  Symptoms Depression:  Change in energy/activity; Fatigue; Hopelessness; Tearfulness; Sleep (too much or little); Irritability   Duration of Depressive symptoms: Duration of Depressive Symptoms: Greater than two weeks   Mania:  Racing thoughts; Change in energy/activity   Anxiety:   Difficulty concentrating; Worrying   Psychosis:  None   Duration of Psychotic symptoms:    Trauma:  None   Obsessions:  None   Compulsions:  None   Inattention:  None   Hyperactivity/Impulsivity:  None   Oppositional/Defiant Behaviors:  None   Emotional Irregularity:  None   Other Mood/Personality Symptoms:  none    Mental Status Exam Appearance and self-care  Stature:  Average   Weight:  Thin   Clothing:  Casual   Grooming:  Normal   Cosmetic use:  None   Posture/gait:  Normal   Motor activity:  Not Remarkable   Sensorium  Attention:  Normal   Concentration:  Normal   Orientation:  Time   Recall/memory:  Normal   Affect and Mood  Affect:  Depressed; Flat; Tearful   Mood:  Depressed; Hopeless; Worthless   Relating  Eye contact:  Normal   Facial expression:  Sad   Attitude toward examiner:  Cooperative; Guarded   Thought and Language  Speech flow: Slow; Blocked   Thought content:  Appropriate to Mood and Circumstances   Preoccupation:  None   Hallucinations:  Auditory   Organization:  Patent attorney of Knowledge:  Fair   Intelligence:  Average   Abstraction:  Normal   Judgement:  Fair   Dance movement psychotherapist:  Distorted   Insight:  Fair; Flashes of insight   Decision Making:  Confused   Social Functioning  Social Maturity:  Impulsive   Social Judgement:  Naive   Stress  Stressors:  Housing; Other (Comment) (Friendships)   Coping Ability:  Exhausted; Overwhelmed   Skill Deficits:  Decision making   Supports:  Support needed     Religion: Religion/Spirituality Are You A Religious Person?: No How Might This Affect  Treatment?: n/a  Leisure/Recreation: Leisure / Recreation Do You Have Hobbies?: No  Exercise/Diet: Exercise/Diet Do You Exercise?: No Have You Gained or Lost A Significant Amount of Weight in the Past Six Months?: No Do You Follow a Special Diet?: No Do You Have Any Trouble Sleeping?: Yes Explanation of Sleeping Difficulties: Pt reports that he is having a heard time sleeping. Pt reports only getting about 30 minutes of sleep last night.   CCA Employment/Education Employment/Work Situation: Employment / Work Situation Employment Situation: Unemployed Patient's Job has Been Impacted by Current Illness: No Has Patient ever Been in Equities trader?: No  Education: Education Is Patient Currently Attending School?: No Last Grade Completed: 12 Did You Product manager?: No Did You Have An Individualized Education Program (IIEP): No Did You Have Any Difficulty At [employer]?: No Patient's Education Has Been Impacted by Current Illness: No   CCA Family/Childhood History Family and Relationship History: Family history Marital status: Married Number of Years Married: 8 What types of issues is patient dealing with in the relationship?: "communication issues " Additional relationship information: n/a Does patient have children?: Yes How many children?: 4 How is patient's relationship with their children?: UTA  Childhood History:  Childhood History By whom was/is the patient raised?: Both parents Did patient suffer any verbal/emotional/physical/sexual abuse as a child?: No Did patient suffer from severe childhood neglect?: No Has patient ever been sexually abused/assaulted/raped as an adolescent or adult?: No Was the patient ever a victim of a crime or a disaster?: No Witnessed domestic violence?: No Has patient been affected by domestic violence as an adult?: No       CCA Substance Use Alcohol/Drug Use: Alcohol / Drug Use Pain Medications: See MAR Prescriptions: See MAR Over  the Counter: See MAR History of alcohol / drug use?: Yes Longest period of sobriety (when/how long): unknown Negative Consequences of Use: Personal relationships, Financial Withdrawal Symptoms: None Substance #1 Name of Substance 1: ETOH 1 - Amount (size/oz): 1/2 pint 1 - Frequency: daily 1 - Last Use / Amount: Last night                       ASAM's:  Six Dimensions of Multidimensional Assessment  Dimension 1:  Acute Intoxication and/or Withdrawal Potential:      Dimension 2:  Biomedical Conditions and Complications:      Dimension 3:  Emotional, Behavioral, or Cognitive Conditions and Complications:     Dimension 4:  Readiness to Change:     Dimension 5:  Relapse, Continued use, or Continued Problem Potential:     Dimension 6:  Recovery/Living Environment:     ASAM Severity Score:  ASAM Recommended Level of Treatment: ASAM Recommended Level of Treatment: Level II Intensive Outpatient Treatment   Substance use Disorder (SUD) Substance Use Disorder (SUD)  Checklist Symptoms of Substance Use: Continued use despite having a persistent/recurrent physical/psychological problem caused/exacerbated by use, Continued use despite persistent or recurrent social, interpersonal problems, caused or exacerbated by use  Recommendations for Services/Supports/Treatments: Recommendations for Services/Supports/Treatments Recommendations For Services/Supports/Treatments: Inpatient Hospitalization  Disposition Recommendation per psychiatric provider: We recommend inpatient psychiatric hospitalization when medically cleared.   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

## 2023-07-21 NOTE — ED Notes (Signed)
 Please call wife for update [phone],

## 2023-07-21 NOTE — ED Triage Notes (Signed)
 Pt reports his mind racing and endorses depression. When asked about SI/HI, pt sts not right now. Pt have difficulty with answering questions in triage s/t rambling, reports SOB. Thinks he has COVID. Denies ETOH use. Sts "alcohol is not the issue"

## 2023-07-21 NOTE — ED Notes (Signed)
 1 belonging bag placed in locker #2

## 2023-07-21 NOTE — ED Notes (Addendum)
 Spoke with pt's wife who is requesting psychiatric update.  Verified contact information in Epic and discussed same with Psychiatric NP on unit to assess pt.

## 2023-07-21 NOTE — ED Provider Notes (Signed)
 [place] EMERGENCY DEPARTMENT AT [facility] Provider Note   CSN: [id number] Arrival date & time: 07/21/23  4098     History Chief Complaint  Patient presents with   Manic Behavior    Jack Castaneda is a 43 y.o. male.  43 yo M w/ h/o depression/anxiety here with worsening depression and suicidal thoughts without a concrete plan. Some reported manic behavior in triage. Tearful on exam today. Denies recent medical problems or complaints. Has been on antidepressants in the past but not currently. Does self medicate with alcohol and some nondisclosed drugs.        Home Medications Prior to Admission medications   Medication Sig Start Date End Date Taking? Authorizing Provider  cephALEXin (KEFLEX) 500 MG capsule Take 1 capsule (500 mg total) by mouth 2 (two) times daily. 11/26/22   Gwyneth Sprout, MD  OLANZapine (ZYPREXA) 5 MG tablet Take 1 tablet (5 mg total) by mouth at bedtime for 7 days. 10/30/22 11/06/22  Layla Barter, NP      Allergies    Banana and Dust mite extract    Review of Systems   Review of Systems  Physical Exam Updated Vital Signs BP 111/88 (BP Location: Right Arm)   Pulse 94   Temp 98.6 F (37 C)   Resp 18   Ht 5\' 4"  (1.626 m)   Wt 60.8 kg   SpO2 100%   BMI 23.00 kg/m  Physical Exam Vitals and nursing note reviewed.  Constitutional:      Appearance: He is well-developed.  HENT:     Head: Normocephalic and atraumatic.  Cardiovascular:     Rate and Rhythm: Normal rate.  Pulmonary:     Effort: Pulmonary effort is normal. No respiratory distress.  Abdominal:     General: There is no distension.  Musculoskeletal:        General: Normal range of motion.     Cervical back: Normal range of motion.  Skin:    General: Skin is warm and dry.  Neurological:     Mental Status: He is alert.  Psychiatric:        Mood and Affect: Mood is depressed. Affect is tearful.        Speech: Speech is not rapid and pressured.        Thought  Content: Thought content includes suicidal ideation. Thought content does not include suicidal plan.     ED Results / Procedures / Treatments   Labs (all labs ordered are listed, but only abnormal results are displayed) Labs Reviewed  CBC - Abnormal; Notable for the following components:      Result Value   WBC 13.5 (*)    All other components within normal limits  RESP PANEL BY RT-PCR (RSV, FLU A&B, COVID)  RVPGX2  COMPREHENSIVE METABOLIC PANEL WITH GFR  ETHANOL  RAPID URINE DRUG SCREEN, HOSP PERFORMED    EKG None  Radiology No results found.  Procedures Procedures    Medications Ordered in ED Medications  acetaminophen (TYLENOL) tablet 650 mg (has no administration in time range)  zolpidem (AMBIEN) tablet 5 mg (has no administration in time range)  ondansetron (ZOFRAN) tablet 4 mg (has no administration in time range)  alum & mag hydroxide-simeth (MAALOX/MYLANTA) 200-200-20 MG/5ML suspension 30 mL (has no administration in time range)  nicotine (NICODERM CQ - dosed in mg/24 hours) patch 21 mg (has no administration in time range)    ED Course/ Medical Decision Making/ A&P  Medical Decision Making Amount and/or Complexity of Data Reviewed Labs: ordered.  Risk OTC drugs. Prescription drug management.   Worsening depression. No meds. Suicidal thoughts without plan. Medically cleared for TTS consultation.    Final Clinical Impression(s) / ED Diagnoses Final diagnoses:  None    Rx / DC Orders ED Discharge Orders     None         Alijah Hyde, Barbara Cower, MD 07/21/23 0600

## 2023-07-21 NOTE — ED Notes (Signed)
 Pt wanded by security.

## 2023-07-21 NOTE — BH Assessment (Signed)
 LCSW Progress Note:   MRN: [id number]  Jack Castaneda. Dial 07/21/2023 5:36 PM  As per Phebe Colla, NP, the patient meets the criteria for inpatient treatment. The patient was referred to [facility], and the [facility] Rosey Bath, RN) reviewed the patient for an inpatient bed. The [facility] confirmed that the patient will be admitted to the [facility] unit later today.
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
## 2023-07-22 ENCOUNTER — Encounter (HOSPITAL_COMMUNITY): Payer: Self-pay | Admitting: Psychiatry

## 2023-07-22 DIAGNOSIS — F3162 Bipolar disorder, current episode mixed, moderate: Secondary | ICD-10-CM | POA: Diagnosis not present

## 2023-07-22 DIAGNOSIS — F1994 Other psychoactive substance use, unspecified with psychoactive substance-induced mood disorder: Secondary | ICD-10-CM | POA: Diagnosis present

## 2023-07-22 DIAGNOSIS — F1210 Cannabis abuse, uncomplicated: Secondary | ICD-10-CM | POA: Insufficient documentation

## 2023-07-22 DIAGNOSIS — F1090 Alcohol use, unspecified, uncomplicated: Secondary | ICD-10-CM | POA: Insufficient documentation

## 2023-07-22 MED ORDER — LORAZEPAM 1 MG PO TABS: 1.0000 mg | ORAL_TABLET | ORAL | Status: AC | PRN

## 2023-07-22 MED ORDER — VITAMIN B-1 100 MG PO TABS
100.0000 mg | ORAL_TABLET | Freq: Every day | ORAL | Status: DC
  Administered 2023-07-22 – 2023-07-25 (×4): 100 mg via ORAL
  Filled 2023-07-22 (×3): qty 1
  Filled 2023-07-22: qty 7
  Filled 2023-07-22 (×2): qty 1

## 2023-07-22 MED ORDER — CLONIDINE HCL 0.1 MG PO TABS
0.1000 mg | ORAL_TABLET | Freq: Once | ORAL | Status: AC
  Administered 2023-07-22: 0.1 mg via ORAL
  Filled 2023-07-22 (×2): qty 1

## 2023-07-22 MED ORDER — LORAZEPAM 2 MG/ML IJ SOLN: 1.0000 mg | INTRAMUSCULAR | Status: DC | PRN

## 2023-07-22 MED ORDER — GABAPENTIN 300 MG PO CAPS
300.0000 mg | ORAL_CAPSULE | Freq: Three times a day (TID) | ORAL | Status: DC
  Administered 2023-07-22 – 2023-07-25 (×8): 300 mg via ORAL
  Filled 2023-07-22 (×12): qty 1

## 2023-07-22 MED ORDER — THIAMINE HCL 100 MG/ML IJ SOLN: 100.0000 mg | Freq: Every day | INTRAMUSCULAR | Status: DC

## 2023-07-22 MED ORDER — FOLIC ACID 1 MG PO TABS
1.0000 mg | ORAL_TABLET | Freq: Every day | ORAL | Status: DC
  Administered 2023-07-22 – 2023-07-25 (×4): 1 mg via ORAL
  Filled 2023-07-22 (×5): qty 1

## 2023-07-22 MED ORDER — ADULT MULTIVITAMIN W/MINERALS CH
1.0000 | ORAL_TABLET | Freq: Every day | ORAL | Status: DC
  Administered 2023-07-22 – 2023-07-25 (×4): 1 via ORAL
  Filled 2023-07-22 (×5): qty 1

## 2023-07-22 NOTE — Plan of Care (Signed)
  Problem: Education: Goal: Mental status will improve Outcome: Progressing Goal: Verbalization of understanding the information provided will improve Outcome: Progressing   Problem: Safety: Goal: Periods of time without injury will increase Outcome: Progressing   Problem: Activity: Goal: Interest or engagement in leisure activities will improve Outcome: Progressing

## 2023-07-22 NOTE — Group Note (Signed)
 [facility] LCSW Group Therapy Note   Group Date: 07/22/2023 Start Time: 1000 End Time: 1110   Type of Therapy/Topic:  Group Therapy:  Emotion Regulation and Identifying source of stress/traumas  Participation Level:  Active   Mood:  Description of Group:    The purpose of this group is to assist patients in learning to regulate negative emotions and experience positive emotions. Patients will be guided to discuss ways in which they have been vulnerable to their negative emotions. These vulnerabilities will be juxtaposed with experiences of positive emotions or situations, and patients challenged to use positive emotions to combat negative ones. Special emphasis will be placed on coping with negative emotions in conflict situations, and patients will process healthy conflict resolution skills.  Therapeutic Goals: Patient will identify two positive emotions or experiences to reflect on in order to balance out negative emotions:  Patient will label two or more emotions that they find the most difficult to experience:  Patient will be able to demonstrate positive conflict resolution skills through discussion or role plays:   Summary of Patient Progress:   Pt was able to identify the age his first trauma event occurred, to include emotional response. The pt was actively engaged and able to discuss positive emotional releases for stress and natural supports in his life.    Therapeutic Modalities:   Cognitive Behavioral Therapy Feelings Identification Dialectical Behavioral Therapy   Steffanie Dunn, LCSWA

## 2023-07-22 NOTE — Progress Notes (Signed)
 Pt is a 43 y.o. M admitted voluntarily to [facility] from [facility] ED for polysubstance abuse and worsening depression. Alert and oriented x 4. Pt presents with flat expression and depressed mood. Pt maintains fair eye contact and is logical/coherent in speech. Cooperative and calm in interactions with staff. Denies SI, HI, and AVH. Pt denies physical pain at this time. Skin assessment completed with Rada Hay, RN with tattoos noted on L upper chest and upper back. Pt belongings searched and secured in locker per protocol. Pt oriented to unit, safety checks initiated at q 15 minutes. Support, encouragement, and reassurance offered to the pt at this time.

## 2023-07-22 NOTE — Progress Notes (Incomplete)
 43 year old African-American male, married but separated, part-time employed.  Background history of substance use disorder and substance related mood disorder.  History of diagnosis of bipolar disorder.  Presented voluntarily on account of worsening depression with thoughts of suicide.  Intoxicated with alcohol and cocaine.  Not exhibiting any psychotic symptoms.  Not in acute withdrawals.  We will maintain aripiprazole at 10 mg daily.  Low-dose gabapentin he initiated during this admission.  We will initiate CIWA protocol.  We will gather collateral and evaluate him further.

## 2023-07-22 NOTE — [facility] Group Notes (Signed)
 [facility] Group Notes:  (Nursing/MHT/Case Management/Adjunct)  Date:  07/22/2023  Time:  8:16 PM  Type of Therapy:   Wrap Up Group  Participation Level:  Did Not Attend   Jack Castaneda 07/22/2023, 8:16 PM
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
## 2023-07-22 NOTE — Plan of Care (Signed)
   Problem: Education: Goal: Knowledge of [facility] Education information/materials will improve Outcome: Progressing Goal: Emotional status will improve Outcome: Progressing Goal: Mental status will improve Outcome: Progressing Goal: Verbalization of understanding the information provided will improve Outcome: Progressing   Problem: Safety: Goal: Periods of time without injury will increase Outcome: Progressing
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
## 2023-07-22 NOTE — Progress Notes (Signed)
 Patient denies SI, HI, AVH, and pain. Scheduled medications administered to patient, per provider orders. Support and encouragement provided. Routine safety checks conducted every 15 minutes. Patient informed to notify staff with problems or concerns.Patient contracts for safety and is compliant with medications and treatment plan. Patient receptive, calm, and cooperative. Remains safe on the unit.    07/22/23 0900  Psych Admission Type (Psych Patients Only)  Admission Status Voluntary  Psychosocial Assessment  Patient Complaints None  Eye Contact Fair  Facial Expression Flat  Affect Appropriate to circumstance  Speech Logical/coherent  Interaction Assertive  Motor Activity Slow  Appearance/Hygiene Unremarkable  Behavior Characteristics Cooperative;Calm  Mood Pleasant;Depressed  Thought Process  Coherency WDL  Content WDL  Delusions None reported or observed  Perception WDL  Hallucination None reported or observed  Judgment Impaired  Confusion None  Danger to Self  Current suicidal ideation? Denies  Agreement Not to Harm Self Yes  Description of Agreement verbal  Danger to Others  Danger to Others None reported or observed

## 2023-07-22 NOTE — Tx Team (Signed)
 Initial Treatment Plan 07/22/2023 1:08 AM Jack Castaneda [id number]    PATIENT STRESSORS: Financial difficulties   Marital or family conflict   Occupational concerns     PATIENT STRENGTHS: Printmaker for treatment/growth  Supportive family/friends    PATIENT IDENTIFIED PROBLEMS: Polysubstance abuse   Depression                    DISCHARGE CRITERIA:  Ability to meet basic life and health needs Improved stabilization in mood, thinking, and/or behavior Verbal commitment to aftercare and medication compliance  PRELIMINARY DISCHARGE PLAN: Outpatient therapy Return to previous living arrangement  PATIENT/FAMILY INVOLVEMENT: This treatment plan has been presented to and reviewed with the patient, Jack Castaneda.  The patient has been given the opportunity to ask questions and make suggestions.  [name] C D'Addio, RN 07/22/2023, 1:08 AM

## 2023-07-22 NOTE — Progress Notes (Signed)
   07/22/23 2211  Psych Admission Type (Psych Patients Only)  Admission Status Voluntary  Psychosocial Assessment  Patient Complaints None  Eye Contact Fair  Facial Expression Flat  Affect Appropriate to circumstance  Speech Logical/coherent  Interaction Assertive  Motor Activity Other (Comment) (WDL)  Appearance/Hygiene Unremarkable  Behavior Characteristics Appropriate to situation  Mood Pleasant  Thought Process  Coherency WDL  Content WDL  Delusions None reported or observed  Perception WDL  Hallucination None reported or observed  Judgment Impaired  Confusion None  Danger to Self  Current suicidal ideation? Denies  Agreement Not to Harm Self Yes  Description of Agreement verbal  Danger to Others  Danger to Others None reported or observed

## 2023-07-22 NOTE — [facility] Suicide Risk Assessment (Signed)
 [facility] Admission Suicide Risk Assessment  Nursing information obtained from:  Patient Demographic factors:  Male, Unemployed Current Mental Status:  NA Loss Factors:  Financial problems / change in socioeconomic status Historical Factors:  NA Risk Reduction Factors:  Living with another person, especially a relative, Positive social support  Total Time spent with patient: 45 minutes Principal Problem: Bipolar disorder (HCC) Diagnosis:  Principal Problem:   Bipolar disorder (HCC) Active Problems:   Polysubstance abuse (HCC)   Alcohol use disorder   Substance induced mood disorder (HCC)   Cannabis abuse, episodic use   Subjective Data:   History of Present Illness:  Jack Castaneda is a 43 y.o., male with a past psychiatric history of bipolar disorder, depression, polysubstance abuse (alcohol, cocaine and THC),induced mood disorder with psychotic symptoms who presents to the [facility] from [facility] for evaluation and management of suicidal ideations without a plan.    Intake assessment, patient reports that he got into an argument with his wife and left the house for a walk. While he was out he did some cocaine and drank some alcohol.Patient returned he was fatigued and was making suicidal statements that led him to go to the ED.    Does report some stressors including financial strain, housing instability ( stays w/ wife or friends) and arguments with his wife. Patient reports that he drinks 0.5-1 pint of liquor x1-2 weekly. Also reports cocaine use x1 weekly, "whenever celebrating". She reports that he also does substances whenever he feels overwhelmed with stress.    Patient reports that his wife was concerned that he would hurt himself and denies suicidal thoughts.  Patient denies depressed mood, issues with sleep, feelings of guilt or hopelessness, issues with concentration, appetite or being self isolative.  He denies current suicidal  thoughts, homicidal ideations or auditory or visual hallucinations at this time.  Protective factors include his daughter.    Patient is aware of his past psychiatric history of bipolar order.  Aware of his last manic episode.  He denies symptoms suggestive of hypomania or mania such as elevated mood in the setting of decreased sleep, pressured speech, increased talkativeness, increased agitation or irritability.  Patient does report previous episodes of impulsivity, grandiose thinking and flight of ideas.   Reports past history of hallucinations including seeing shadow walkers and voices over his shoulder. Patient reports that voices are trying to persuade him to do good things and mostly positive.  Patient denies any history of thought broadcasting or paranoia.   Was previously seen in the behavioral health urgent care on October 29, 2022 concern of substance-induced mood disorder with psychosis and was prescribed Zyprexa 10 mg nightly for a 7-day course.  She denies picking up medications " I didn't execute the plan".  He denies being on psychiatric medications outpatient or having any psychiatry follow-up or therapeutic services.     Chart review: On chart review, prior to this evaluation, patient presented [facility]. Labs were remarkable for UDS positive for cocaine and THC. EtoH 132.  Elevated AST at 67, potassium 3.4 psych was consulted and recommended starting Abilify 10 mg daily and inpatient psychiatric treatment for symptom stabilization.     Subjective Sleep past 24 hours: fair Subjective Appetite past 24 hours: fair   Collateral information obtained (none)   Attempted to call wife, Altin Sease [phone]), no answer  Will contact at later time during admission     Past Psychiatric History:  Previous psych diagnoses:  Bipolar, schizophrenia, substance-induced mood disorder, substance-induced psychosis, polysubstance, alcohol use disorder, cannabis use, stimulant use disorder  cocaine type Prior inpatient psychiatric treatment:  [facility] in 2015 and short stay at behavioral health urgent care in October 29, 2022 Prior outpatient psychiatric treatment:  Previously with [facility] , denies current psychiatry follow-up Current psychiatric provider: Denies   Neuromodulation history: denies   Current therapist: Denies Psychotherapy hx: Denies   History of suicide attempts:  2015 where he attempted to walk out in traffic History of homicide: Denies   Psychotropic medications: Current None   Past Zyprexa, fluphenazine, hydroxyzine, Depakote, Abilify   Substance Use History: Alcohol: Patient reports that he drinks 0.5-1 pint of liquor x1-2 weekly Hx withdrawal tremors/shakes: denies Hx alcohol related blackouts: denies Hx alcohol induced hallucinations: denies Hx alcoholic seizures: denies Hx medical hospitalization due to severe alcohol withdrawal symptoms: denies DUI: Yes, suspended license ~1610   --------   Tobacco: Yes, 1 PP week  Cannabis (marijuana): Yes , x1 a week Cocaine: Yes , x1 a week Methamphetamines:  Denies  Psilocybin (mushrooms): Denies Ecstasy (MDMA / molly): Denies LSD (acid): Denies Opiates (fentanyl / heroin): Denies Benzos (Xanax, Klonopin): Denies IV drug use: Denies Prescribed meds abuse: Denies   History of detox: Denies History of rehab: Denies Previously gone to NA and AA meetings    Is the patient at risk to self? Yes Has the patient been a risk to self in the past 6 months? No Has the patient been a risk to self within the distant past? Yes Is the patient a risk to others? No Has the patient been a risk to others in the past 6 months? No Has the patient been a risk to others within the distant past? No    Continued Clinical Symptoms:  Alcohol Use Disorder Identification Test Final Score (AUDIT): 10 The "Alcohol Use Disorders Identification Test", Guidelines for Use in Primary Care, Second Edition.  World Environmental consultant [facility]). Score between 0-7:  no or low risk or alcohol related problems. Score between 8-15:  moderate risk of alcohol related problems. Score between 16-19:  high risk of alcohol related problems. Score 20 or above:  warrants further diagnostic evaluation for alcohol dependence and treatment.  CLINICAL FACTORS:   Bipolar Disorder:   Depressive phase Alcohol/Substance Abuse/Dependencies More than one psychiatric diagnosis Unstable or Poor Therapeutic Relationship Previous Psychiatric Diagnoses and Treatments  Musculoskeletal: Strength & Muscle Tone: within normal limits Gait & Station: normal Patient leans: N/A  Psychiatric Specialty Exam  Presentation  General Appearance:  Appropriate for Environment; Casual  Eye Contact: Fair  Speech: Clear and Coherent; Normal Rate  Speech Volume: Normal  Handedness: Right   Mood and Affect  Mood: Euthymic  Duration of Depression Symptoms:  Greater than two weeks  Affect: Appropriate; Congruent   Thought Process  Thought Processes: Coherent  Descriptions of Associations: Circumstantial  Orientation: Full (Time, Place and Person)  Thought Content: Logical  History of Schizophrenia/Schizoaffective disorder: Yes  Duration of Psychotic Symptoms: N/A  Hallucinations:Hallucinations: Auditory  Ideas of Reference: None  Suicidal Thoughts:Suicidal Thoughts: No  Homicidal Thoughts:Homicidal Thoughts: No   Sensorium  Memory: Immediate Fair  Judgment: Intact  Insight: Present   Executive Functions  Concentration: Fair  Attention Span: Fair  Recall: Poor  Fund of Knowledge: Fair  Language: Fair   Psychomotor Activity  Psychomotor Activity:Psychomotor Activity: Normal   Assets  Assets: Communication Skills; Desire for Improvement; Intimacy; Social Support   Sleep  Sleep:Sleep: Fair   Physical Exam:  Review of Systems Review of Systems  Constitutional:  Negative for  chills and fever.  Respiratory:  Negative for cough.   Cardiovascular:  Negative for chest pain.  Gastrointestinal:  Negative for nausea and vomiting.  Neurological:  Negative for headaches.  Psychiatric/Behavioral:  Positive for substance abuse. Negative for depression, hallucinations and suicidal ideas. The patient is not nervous/anxious.       Vital signs: Blood pressure (!) 147/94, pulse 81, temperature 98.8 F (37.1 C), temperature source Oral, resp. rate 14, height 5\' 5"  (1.651 m), weight 62.3 kg, SpO2 100%. Body mass index is 22.86 kg/m. Physical Exam Constitutional:      Appearance: Normal appearance.  Eyes:     Conjunctiva/sclera: Conjunctivae normal.  Pulmonary:     Effort: Pulmonary effort is normal.  Musculoskeletal:        General: Normal range of motion.  Neurological:     Mental Status: He is alert and oriented to person, place, and time.  Psychiatric:        Attention and Perception: He does not perceive auditory or visual hallucinations.        Mood and Affect: Mood is not anxious or depressed. Affect is not angry.        Speech: Speech is not rapid and pressured.        Behavior: Behavior is not aggressive or combative. Behavior is cooperative.        Thought Content: Thought content is not paranoid or delusional. Thought content does not include homicidal or suicidal ideation. Thought content does not include homicidal or suicidal plan.   COGNITIVE FEATURES THAT CONTRIBUTE TO RISK:  None    SUICIDE RISK:   Mild:  There are no identifiable suicide plans, no associated intent, mild dysphoria and related symptoms, good self-control (both objective and subjective assessment), few other risk factors, and identifiable protective factors, including available and accessible social support.   PLAN OF CARE: see H&P for full plan of care  I certify that inpatient services furnished can reasonably be expected to improve the patient's condition.   Signed: Peterson Ao, MD 07/22/2023, 10:23 PM

## 2023-07-23 DIAGNOSIS — F3162 Bipolar disorder, current episode mixed, moderate: Secondary | ICD-10-CM | POA: Diagnosis not present

## 2023-07-23 MED ORDER — NALTREXONE HCL 50 MG PO TABS
50.0000 mg | ORAL_TABLET | Freq: Every day | ORAL | Status: DC
  Administered 2023-07-23 – 2023-07-25 (×3): 50 mg via ORAL
  Filled 2023-07-23 (×4): qty 1

## 2023-07-23 NOTE — Plan of Care (Signed)
   Problem: Education: Goal: Emotional status will improve Outcome: Progressing Goal: Mental status will improve Outcome: Progressing

## 2023-07-23 NOTE — Group Note (Signed)
 Date:  07/23/2023 Time:  8:49 PM  Group Topic/Focus:  Wrap-Up Group:   The focus of this group is to help patients review their daily goal of treatment and discuss progress on daily workbooks.    Participation Level:  Active  Participation Quality:  Appropriate  Affect:  Appropriate  Cognitive:  Appropriate  Insight: Appropriate  Engagement in Group:  Engaged  Modes of Intervention:  Education and Exploration  Additional Comments:  Patient attended and participated in group tonight.  He reports that his stomach bug is now gone after taking some medication.  Lita Mains [facility] 07/23/2023, 8:49 PM

## 2023-07-23 NOTE — Progress Notes (Signed)
   07/23/23 2140  Psych Admission Type (Psych Patients Only)  Admission Status Voluntary  Psychosocial Assessment  Patient Complaints None  Eye Contact Fair  Facial Expression Flat  Affect Appropriate to circumstance  Speech Logical/coherent  Interaction Assertive  Motor Activity Other (Comment) (WDL)  Appearance/Hygiene Unremarkable  Behavior Characteristics Appropriate to situation  Mood Pleasant  Thought Process  Coherency WDL  Content WDL  Delusions None reported or observed  Perception WDL  Hallucination None reported or observed  Judgment Impaired  Confusion None  Danger to Self  Current suicidal ideation? Denies  Agreement Not to Harm Self Yes  Description of Agreement verbal  Danger to Others  Danger to Others None reported or observed

## 2023-07-23 NOTE — Progress Notes (Signed)
   07/23/23 0900  Psych Admission Type (Psych Patients Only)  Admission Status Voluntary  Psychosocial Assessment  Patient Complaints None  Eye Contact Fair  Facial Expression Flat  Affect Appropriate to circumstance  Speech Logical/coherent  Interaction Assertive  Motor Activity Other (Comment) (WNL)  Appearance/Hygiene Unremarkable  Behavior Characteristics Appropriate to situation  Mood Pleasant  Thought Process  Coherency WDL  Content WDL  Delusions None reported or observed  Perception WDL  Hallucination None reported or observed  Judgment Impaired  Confusion None  Danger to Self  Current suicidal ideation? Denies  Agreement Not to Harm Self Yes  Description of Agreement verbal  Danger to Others  Danger to Others None reported or observed

## 2023-07-23 NOTE — Plan of Care (Signed)
   Problem: Education: Goal: Knowledge of [place] [facility] Education information/materials will improve Outcome: Progressing

## 2023-07-23 NOTE — Progress Notes (Signed)
   07/23/23 0530  15 Minute Checks  Location Bedroom  Visual Appearance Calm  Behavior Sleeping  Sleep (Behavioral Health Patients Only)  Calculate sleep? (Click Yes once per 24 hr at 0600 safety check) Yes  Documented sleep last 24 hours 10.25

## 2023-07-23 NOTE — Group Note (Signed)
 Date:  07/23/2023 Time:  8:58 AM  Group Topic/Focus:  Goals Group:   The focus of this group is to help patients establish daily goals to achieve during treatment and discuss how the patient can incorporate goal setting into their daily lives to aide in recovery. Orientation:   The focus of this group is to educate the patient on the purpose and policies of crisis stabilization and provide a format to answer questions about their admission.  The group details unit policies and expectations of patients while admitted.    Participation Level:  Did Not Attend  Participation Quality:   n/a  Affect:   n/a  Cognitive:   n/a  Insight: None  Engagement in Group:   n/a  Modes of Intervention:   n/a  Additional Comments:    Jack Castaneda 07/23/2023, 8:58 AM

## 2023-07-23 NOTE — Progress Notes (Signed)
 [facility] MD Progress Note  07/23/2023 11:05 AM Jack Castaneda  MRN:  [id number] Subjective:   43 year old African-American male, married but separated, part-time employed. Background history of substance use disorder and substance related mood disorder. History of diagnosis of bipolar disorder. Presented voluntarily on account of worsening depression with thoughts of suicide. Intoxicated with alcohol and cocaine.  Chart reviewed today.  Patient discussed at multidisciplinary team meeting.  Nursing staff reports that patient has been appropriate on the unit.  No challenging behavior.  He has been adherent with his medications.  No PRNs required.  BP is mildly elevated but trending downwards.  No observed response to internal stimuli.  Seen today.  Patient tells me that he has been resting mostly in his room as he feels tired.  States that he plans to attend groups as his energy comes back.  He is not experiencing visual/auditory/tactile hallucinations.  No feeling of impending doom.  He is not expressing any delusion.  States that he has been in communication with his wife.  States that his wife is supportive.  Patient does not have any rageful thoughts towards himself or towards anyone else.  He tells me alcohol use is his primary issue as he tends to use cocaine thereafter.  He has been in AA and NA in the past.  He plans to find a new group.  No desire for inpatient rehab as he plans to stay busy with work.  I explored the use of naltrexone to target alcohol craving.  Patient consented to treatment after reviewing the pros and cons. Encouraged to keep ventilating his feelings to staff.  Principal Problem: Bipolar disorder (HCC) Diagnosis: Principal Problem:   Bipolar disorder (HCC) Active Problems:   Polysubstance abuse (HCC)   Alcohol use disorder   Substance induced mood disorder (HCC)   Cannabis abuse, episodic use  Total Time spent with patient: 30 minutes  Past Psychiatric History:  See  H&P.  Past Medical History:  Past Medical History:  Diagnosis Date   Bipolar 1 disorder (HCC)    Depression    History reviewed. No pertinent surgical history. Family History: History reviewed. No pertinent family history. Family Psychiatric  History:  See H&P.  Social History:  Social History   Substance and Sexual Activity  Alcohol Use Yes   Comment: drinks 1/2 pint liquor once per week     Social History   Substance and Sexual Activity  Drug Use Not Currently   Types: Marijuana    Social History   Socioeconomic History   Marital status: Single    Spouse name: Not on file   Number of children: Not on file   Years of education: Not on file   Highest education level: Not on file  Occupational History   Not on file  Tobacco Use   Smoking status: Every Day    Current packs/day: 0.50    Types: Cigarettes   Smokeless tobacco: Never  Vaping Use   Vaping status: Never Used  Substance and Sexual Activity   Alcohol use: Yes    Comment: drinks 1/2 pint liquor once per week   Drug use: Not Currently    Types: Marijuana   Sexual activity: Not on file  Other Topics Concern   Not on file  Social History Narrative   Not on file   Social Drivers of Health   Financial Resource Strain: Not on file  Food Insecurity: No Food Insecurity (07/22/2023)   Hunger Vital Sign    Worried  About Running Out of Food in the Last Year: Never true    Ran Out of Food in the Last Year: Never true  Transportation Needs: No Transportation Needs (07/22/2023)   PRAPARE - Administrator, Civil Service (Medical): No    Lack of Transportation (Non-Medical): No  Physical Activity: Not on file  Stress: Not on file  Social Connections: Not on file   Additional Social History:    Current Medications: Current Facility-Administered Medications  Medication Dose Route Frequency Provider Last Rate Last Admin   acetaminophen (TYLENOL) tablet 650 mg  650 mg Oral Q6H PRN Weber, Kyra A, NP        alum & mag hydroxide-simeth (MAALOX/MYLANTA) 200-200-20 MG/5ML suspension 30 mL  30 mL Oral Q4H PRN Weber, Kyra A, NP       ARIPiprazole (ABILIFY) tablet 10 mg  10 mg Oral Daily Weber, Kyra A, NP   10 mg at 07/23/23 0802   haloperidol (HALDOL) tablet 5 mg  5 mg Oral TID PRN Weber, Kyra A, NP       And   diphenhydrAMINE (BENADRYL) capsule 50 mg  50 mg Oral TID PRN Weber, Kyra A, NP       haloperidol lactate (HALDOL) injection 5 mg  5 mg Intramuscular TID PRN Weber, Kyra A, NP       And   diphenhydrAMINE (BENADRYL) injection 50 mg  50 mg Intramuscular TID PRN Weber, Bella Kennedy A, NP       And   LORazepam (ATIVAN) injection 2 mg  2 mg Intramuscular TID PRN Weber, Kyra A, NP       haloperidol lactate (HALDOL) injection 10 mg  10 mg Intramuscular TID PRN Weber, Kyra A, NP       And   diphenhydrAMINE (BENADRYL) injection 50 mg  50 mg Intramuscular TID PRN Weber, Bella Kennedy A, NP       And   LORazepam (ATIVAN) injection 2 mg  2 mg Intramuscular TID PRN Weber, Bella Kennedy A, NP       folic acid (FOLVITE) tablet 1 mg  1 mg Oral Daily Peterson Ao, MD   1 mg at 07/23/23 3244   gabapentin (NEURONTIN) capsule 300 mg  300 mg Oral TID Peterson Ao, MD   300 mg at 07/23/23 0102   hydrOXYzine (ATARAX) tablet 25 mg  25 mg Oral TID PRN Phebe Colla A, NP       LORazepam (ATIVAN) tablet 1-4 mg  1-4 mg Oral Q1H PRN Peterson Ao, MD       magnesium hydroxide (MILK OF MAGNESIA) suspension 30 mL  30 mL Oral Daily PRN Weber, Bella Kennedy A, NP       multivitamin with minerals tablet 1 tablet  1 tablet Oral Daily Peterson Ao, MD   1 tablet at 07/23/23 0802   nicotine (NICODERM CQ - dosed in mg/24 hours) patch 21 mg  21 mg Transdermal Daily Weber, Kyra A, NP       ondansetron (ZOFRAN) tablet 4 mg  4 mg Oral Q8H PRN Weber, Kyra A, NP       thiamine (Vitamin B-1) tablet 100 mg  100 mg Oral Daily Peterson Ao, MD   100 mg at 07/23/23 7253    Lab Results: No results found for this or any previous visit (from the past 48  hours).  Blood Alcohol level:  Lab Results  Component Value Date   ETH 132 (H) 07/21/2023   ETH 201 (H) 11/26/2022    Metabolic Disorder Labs:  Lab Results  Component Value Date   HGBA1C 5.4 10/29/2022   MPG 108.28 10/29/2022   Lab Results  Component Value Date   PROLACTIN 5.5 10/29/2022   Lab Results  Component Value Date   CHOL 209 (H) 10/29/2022   TRIG 110 10/29/2022   HDL 84 10/29/2022   CHOLHDL 2.5 10/29/2022   VLDL 22 10/29/2022   LDLCALC 103 (H) 10/29/2022    Physical Findings: AIMS:  , ,  ,  ,    CIWA:  CIWA-Ar Total: 0 COWS:     Musculoskeletal: Strength & Muscle Tone: within normal limits Gait & Station: normal Patient leans: N/A  Psychiatric Specialty Exam:  Presentation  General Appearance:  Casually dressed, not in any distress, engaged politely.  No EPS.  Eye Contact: Good.  Speech: Spontaneous, soft spoken.  Mood and Affect  Mood: Euthymic  Affect: Restricted and appropriate.  Thought Process  Thought Processes: Normal speed of thought, linear and goal directed.  Descriptions of Associations: Intact.  Orientation:Full (Time, Place and Person)  Thought Content: No delusional theme.  No negative ruminative flooding.  No guilty ruminations.  No suicidal thoughts.  No homicidal thoughts.  No thoughts of violence.  No obsessions.  Hallucinations: No hallucination in any modality.  Sensorium  Memory: Good.  Judgment: Fair.  Insight: Limited as he is not fully committed to addiction treatment.  Executive Functions  Concentration: Good.  Attention Span: Good.  Recall: Good.  Fund of Knowledge: Good.  Language: Good.   Psychomotor Activity  Normalizing psychomotor activity.  Physical Exam: Physical Exam ROS Blood pressure (!) 142/112, pulse 73, temperature 98.7 F (37.1 C), temperature source Oral, resp. rate 14, height 5\' 5"  (1.651 m), weight 62.3 kg, SpO2 100%. Body mass index is 22.86  kg/m.   Treatment Plan Summary: Patient presented with intoxication of alcohol and cocaine.  He is detoxing smoothly.  His blood pressure is trending down.  There are no psychotic symptoms.  No rageful thoughts towards self or others since his being on the unit.  He is doing well on his mood stabilizer.  We will add naltrexone to curb cravings.  We need collateral from his wife.  We will evaluate him further.  1.  Continue aripiprazole 10 mg daily. 2.  Continue gabapentin 300 mg 3 times daily. 3.  Add naltrexone 50 mg daily. 4.  Motivational enhancement. 5.  Encourage unit groups and therapeutic activities. 6.  Continue to monitor mood behavior and interaction with others. 7.  Social worker will gather collateral from his family. 8.  Social worker will coordinate discharge and aftercare planning.   Georgiann Cocker, MD 07/23/2023, 11:05 AM

## 2023-07-24 ENCOUNTER — Encounter (HOSPITAL_COMMUNITY): Payer: Self-pay

## 2023-07-24 DIAGNOSIS — F3162 Bipolar disorder, current episode mixed, moderate: Secondary | ICD-10-CM | POA: Diagnosis not present

## 2023-07-24 MED ORDER — AMLODIPINE BESYLATE 5 MG PO TABS
5.0000 mg | ORAL_TABLET | Freq: Every day | ORAL | Status: DC
  Administered 2023-07-24 – 2023-07-25 (×2): 5 mg via ORAL
  Filled 2023-07-24 (×4): qty 1

## 2023-07-24 MED ORDER — NICOTINE POLACRILEX 2 MG MT GUM: 2.0000 mg | CHEWING_GUM | OROMUCOSAL | Status: DC | PRN

## 2023-07-24 MED ORDER — CLONIDINE HCL 0.1 MG PO TABS
0.1000 mg | ORAL_TABLET | Freq: Three times a day (TID) | ORAL | Status: DC | PRN
Start: 2023-07-24 — End: 2023-07-25
  Administered 2023-07-24: 0.1 mg via ORAL
  Filled 2023-07-24: qty 1

## 2023-07-24 NOTE — Progress Notes (Signed)
   07/24/23 2200  Psych Admission Type (Psych Patients Only)  Admission Status Voluntary  Psychosocial Assessment  Patient Complaints None  Eye Contact Fair  Facial Expression Animated  Affect Appropriate to circumstance  Speech Logical/coherent  Interaction Assertive  Motor Activity Slow  Appearance/Hygiene Unremarkable  Behavior Characteristics Cooperative;Appropriate to situation  Mood Pleasant  Thought Process  Coherency WDL  Content WDL  Delusions None reported or observed  Perception WDL  Hallucination None reported or observed  Judgment Limited  Confusion None  Danger to Self  Current suicidal ideation? Denies  Agreement Not to Harm Self Yes  Description of Agreement verbal  Danger to Others  Danger to Others None reported or observed

## 2023-07-24 NOTE — Plan of Care (Signed)
 Nurse discussed anxiety, depression and coping skills with patient.

## 2023-07-24 NOTE — [facility] Group Notes (Signed)
 [facility] Group Notes:  (Nursing/MHT/Case Management/Adjunct)  Date:  07/24/2023  Time:  2000  Type of Therapy:   Alcoholics Anonymous Meeting  Participation Level:  Active  Participation Quality:  Appropriate, Attentive, Sharing, and Supportive  Affect:  Appropriate and Tearful  Cognitive:  Alert  Insight:  Improving  Engagement in Group:  Engaged  Modes of Intervention:  Clarification, Education, and Support  Summary of Progress/Problems:  Marcille Buffy 07/24/2023, 9:21 PM

## 2023-07-24 NOTE — Group Note (Signed)
 Date:  07/24/2023 Time:  9:46 AM  Group Topic/Focus:  Goals Group:   The focus of this group is to help patients establish daily goals to achieve during treatment and discuss how the patient can incorporate goal setting into their daily lives to aide in recovery.    Participation Level:  Active  Participation Quality:  Appropriate and Attentive  Affect:  Appropriate  Cognitive:  Alert and Appropriate  Insight: Appropriate  Engagement in Group:  Engaged  Modes of Intervention:  Activity, Clarification, Socialization, and Support  Additional Comments:  Patient has clear goals set for today.  Merlene Morse 07/24/2023, 9:46 AM

## 2023-07-24 NOTE — Plan of Care (Signed)
   Problem: Education: Goal: Knowledge of [facility] Education information/materials will improve Outcome: Progressing   Problem: Activity: Goal: Interest or engagement in activities will improve Outcome: Progressing   Problem: Coping: Goal: Ability to verbalize frustrations and anger appropriately will improve Outcome: Progressing

## 2023-07-24 NOTE — BH IP Treatment Plan (Signed)
 Interdisciplinary Treatment and Diagnostic Plan Update  07/24/2023 Time of Session: 1116 Jack Castaneda MRN: [id number]  Principal Diagnosis: Bipolar disorder [facility])  Secondary Diagnoses: Principal Problem:   Bipolar disorder (HCC) Active Problems:   Polysubstance abuse (HCC)   Alcohol use disorder   Substance induced mood disorder (HCC)   Cannabis abuse, episodic use   Current Medications:  Current Facility-Administered Medications  Medication Dose Route Frequency Provider Last Rate Last Admin   acetaminophen (TYLENOL) tablet 650 mg  650 mg Oral Q6H PRN Weber, Kyra A, NP       alum & mag hydroxide-simeth (MAALOX/MYLANTA) 200-200-20 MG/5ML suspension 30 mL  30 mL Oral Q4H PRN Weber, Kyra A, NP       amLODipine (NORVASC) tablet 5 mg  5 mg Oral Daily Golda Acre, MD   5 mg at 07/24/23 1311   ARIPiprazole (ABILIFY) tablet 10 mg  10 mg Oral Daily Weber, Kyra A, NP   10 mg at 07/24/23 0857   cloNIDine (CATAPRES) tablet 0.1 mg  0.1 mg Oral TID PRN Golda Acre, MD       haloperidol (HALDOL) tablet 5 mg  5 mg Oral TID PRN Weber, Bella Kennedy A, NP       And   diphenhydrAMINE (BENADRYL) capsule 50 mg  50 mg Oral TID PRN Weber, Kyra A, NP       haloperidol lactate (HALDOL) injection 5 mg  5 mg Intramuscular TID PRN Weber, Kyra A, NP       And   diphenhydrAMINE (BENADRYL) injection 50 mg  50 mg Intramuscular TID PRN Weber, Bella Kennedy A, NP       And   LORazepam (ATIVAN) injection 2 mg  2 mg Intramuscular TID PRN Weber, Kyra A, NP       haloperidol lactate (HALDOL) injection 10 mg  10 mg Intramuscular TID PRN Weber, Kyra A, NP       And   diphenhydrAMINE (BENADRYL) injection 50 mg  50 mg Intramuscular TID PRN Weber, Bella Kennedy A, NP       And   LORazepam (ATIVAN) injection 2 mg  2 mg Intramuscular TID PRN Weber, Bella Kennedy A, NP       folic acid (FOLVITE) tablet 1 mg  1 mg Oral Daily Peterson Ao, MD   1 mg at 07/24/23 0857   gabapentin (NEURONTIN) capsule 300 mg  300 mg Oral TID Peterson Ao, MD   300  mg at 07/24/23 1256   hydrOXYzine (ATARAX) tablet 25 mg  25 mg Oral TID PRN Phebe Colla A, NP   25 mg at 07/23/23 2107   LORazepam (ATIVAN) tablet 1-4 mg  1-4 mg Oral Q1H PRN Peterson Ao, MD       magnesium hydroxide (MILK OF MAGNESIA) suspension 30 mL  30 mL Oral Daily PRN Weber, Kyra A, NP       multivitamin with minerals tablet 1 tablet  1 tablet Oral Daily Peterson Ao, MD   1 tablet at 07/24/23 0857   naltrexone (DEPADE) tablet 50 mg  50 mg Oral Daily Izediuno, Delight Ovens, MD   50 mg at 07/24/23 0857   nicotine polacrilex (NICORETTE) gum 2 mg  2 mg Oral PRN Peterson Ao, MD       ondansetron [facility]) tablet 4 mg  4 mg Oral Q8H PRN Weber, Kyra A, NP       thiamine (Vitamin B-1) tablet 100 mg  100 mg Oral Daily Peterson Ao, MD   100 mg at 07/24/23 [phone]  PTA Medications: No medications prior to admission.    Patient Stressors: Financial difficulties   Marital or family conflict   Occupational concerns    Patient Strengths: Printmaker for treatment/growth  Supportive family/friends   Treatment Modalities: Medication Management, Group therapy, Case management,  1 to 1 session with clinician, Psychoeducation, Recreational therapy.   Physician Treatment Plan for Primary Diagnosis: Bipolar disorder (HCC) Long Term Goal(s):     Short Term Goals:    Medication Management: Evaluate patient's response, side effects, and tolerance of medication regimen.  Therapeutic Interventions: 1 to 1 sessions, Unit Group sessions and Medication administration.  Evaluation of Outcomes: Not Progressing  Physician Treatment Plan for Secondary Diagnosis: Principal Problem:   Bipolar disorder (HCC) Active Problems:   Polysubstance abuse (HCC)   Alcohol use disorder   Substance induced mood disorder (HCC)   Cannabis abuse, episodic use  Long Term Goal(s):     Short Term Goals:       Medication Management: Evaluate patient's response, side effects, and  tolerance of medication regimen.  Therapeutic Interventions: 1 to 1 sessions, Unit Group sessions and Medication administration.  Evaluation of Outcomes: Not Progressing   RN Treatment Plan for Primary Diagnosis: Bipolar disorder (HCC) Long Term Goal(s): Knowledge of disease and therapeutic regimen to maintain health will improve  Short Term Goals: Ability to demonstrate self-control, Ability to participate in decision making will improve, Ability to verbalize feelings will improve, Ability to disclose and discuss suicidal ideas, and Ability to identify and develop effective coping behaviors will improve  Medication Management: RN will administer medications as ordered by provider, will assess and evaluate patient's response and provide education to patient for prescribed medication. RN will report any adverse and/or side effects to prescribing provider.  Therapeutic Interventions: 1 on 1 counseling sessions, Psychoeducation, Medication administration, Evaluate responses to treatment, Monitor vital signs and CBGs as ordered, Perform/monitor CIWA, COWS, AIMS and Fall Risk screenings as ordered, Perform wound care treatments as ordered.  Evaluation of Outcomes: Not Progressing   LCSW Treatment Plan for Primary Diagnosis: Bipolar disorder [facility]) Long Term Goal(s): Safe transition to appropriate next level of care at discharge, Engage patient in therapeutic group addressing interpersonal concerns.  Short Term Goals: Engage patient in aftercare planning with referrals and resources, Increase ability to appropriately verbalize feelings, Facilitate acceptance of mental health diagnosis and concerns, and Identify triggers associated with mental health/substance abuse issues  Therapeutic Interventions: Assess for all discharge needs, 1 to 1 time with Social worker, Explore available resources and support systems, Assess for adequacy in community support network, Educate family and significant other(s)  on suicide prevention, Complete Psychosocial Assessment, Interpersonal group therapy.  Evaluation of Outcomes: Not Progressing   Progress in Treatment: Attending groups: Yes. Participating in groups: Yes. Taking medication as prescribed: Yes. Toleration medication: Yes. Family/Significant other contact made: No, will contact:  SPOUSE, KEDRA Mckenzie Patient understands diagnosis: Yes. Discussing patient identified problems/goals with staff: Yes. Medical problems stabilized or resolved: Yes. Denies suicidal/homicidal ideation: Yes. Issues/concerns per patient self-inventory: No. Other: NONE  New problem(s) identified: No, Describe:  N/A  New Short Term/Long Term Goal(s): medication management for mood stabilization; elimination of SI thoughts; development of comprehensive mental wellness/sobriety plan  Patient Goals: help with "anxiety, I'm not a people person"  Discharge Plan or Barriers: Patient recently admitted. CSW will continue to follow and assess for appropriate referrals and possible discharge planning.   Reason for Continuation of Hospitalization: Anxiety Other; describe Mood stabilization, discharge planning  Estimated  Length of Stay: 3-5 Days  Last 3 [place] Suicide Severity Risk Score: Flowsheet Row [facility]Current[facility] Most recent reading at 07/22/2023 12:00 AM ED from 07/21/2023 in [facility] Emergency Department at [facility] Most recent reading at 07/21/2023  6:46 AM ED from 10/29/2022 in [facility] Most recent reading at 10/30/2022  1:06 AM  C-SSRS RISK CATEGORY No Risk Low Risk Low Risk       Last PHQ 2/9 Scores:     No data to display          Scribe for Treatment Team: Jacinta Shoe, LCSW 07/24/2023 4:21 PM

## 2023-07-24 NOTE — Group Note (Signed)
 Date:  07/24/2023 Time:  12:57 PM  Group Topic/Focus:  Building Self Esteem:   The Focus of this group is helping patients become aware of the effects of self-esteem on their lives, the things they and others do that enhance or undermine their self-esteem, seeing the relationship between their level of self-esteem and the choices they make and learning ways to enhance self-esteem.    Participation Level:  Active  Participation Quality:  Appropriate and Attentive  Affect:  Appropriate  Cognitive:  Alert and Appropriate  Insight: Appropriate  Engagement in Group:  Engaged  Modes of Intervention:  Problem-solving and Role-play  Additional Comments:  Patient was engaged in this exercise.    Merlene Morse 07/24/2023, 12:57 PM

## 2023-07-24 NOTE — Group Note (Signed)
 Recreation Therapy Group Note   Group Topic:Healthy Decision Making  Group Date: 07/24/2023 Start Time: 1610 End Time: 1015 Facilitators: Phoenyx Melka-McCall, LRT,CTRS Location: 300 [address] Dayroom   Group Topic: Decision Making, Problem Solving, Communication  Goal Area(s) Addresses:  Patient will effectively work with peer towards shared goal.  Patient will identify factors that guided their decision making.  Patient will pro-socially communicate ideas during group session.   Intervention: Survival Scenario - pencil, paper  Activity: Patients were given a scenario that they were going to be stranded on a deserted [place] for several months before being rescued. Writer tasked them with making a list of 15 things they would choose to bring with them for "survival". The list of items was prioritized most important to least. Each patient would come up with their own list, then work together to create a new list of 15 items while in a group of 3-5 peers. LRT discussed each person's list and how it differed from others. The debrief included discussion of priorities, good decisions versus bad decisions, and how it is important to think before acting so we can make the best decision possible. LRT tied the concept of effective communication among group members to patient's support systems outside of the hospital and its benefit post discharge.  Education: Pharmacist, community, Priorities, Support System, Discharge Planning   Education Outcome: Acknowledges education/In group clarification/Needs additional education   Affect/Mood: Appropriate   Participation Level: Engaged   Participation Quality: Independent   Behavior: Appropriate   Speech/Thought Process: Focused   Insight: Good   Judgement: Good   Modes of Intervention: Group work   Patient Response to Interventions:  Engaged   Education Outcome:  In group clarification offered    Clinical Observations/Individualized Feedback: Pt  was bright and engaged during group session. Pt identified items such as clothes, shoes, dog, life raft and scuba gear as things he would take with him. Once teamed with partner, they came up with things like flash light, four wheeler, shotgun/shells, fishing net/rod. Pt worked well with peer and was attentive throughout group session.    Plan: Continue to engage patient in RT group sessions 2-3x/week.   Teddi Badalamenti-McCall, LRT,CTRS 07/24/2023 12:34 PM

## 2023-07-24 NOTE — Group Note (Signed)
 Date:  07/24/2023 Time:  10:56 AM  Group Topic/Focus:  Developing a Wellness Toolbox:   The focus of this group is to help patients develop a "wellness toolbox" with skills and strategies to promote recovery upon discharge. Dimensions of Wellness:   The focus of this group is to introduce the topic of wellness and discuss the role each dimension of wellness plays in total health.    Participation Level:  Active  Participation Quality:  Appropriate and Attentive  Affect:  Appropriate  Cognitive:  Appropriate  Insight: Appropriate  Engagement in Group:  Supportive  Modes of Intervention:  Socialization and Support  Additional Comments:  Patient acknowledges emotions and will choose positive thoughts.    Merlene Morse 07/24/2023, 10:56 AM

## 2023-07-24 NOTE — Plan of Care (Addendum)
D:  Patient denied SI and HI, contracts for safety.  Denied A/V hallucinations.  Denied pain. A:  Medications administered per MD orders.  Emotional support and encouragement given patient. R:  Safety maintained with 15 minute checks.  Patient stated he slept 8 hours last night.

## 2023-07-24 NOTE — Progress Notes (Signed)
   07/24/23 0530  15 Minute Checks  Location Bedroom  Visual Appearance Calm  Behavior Sleeping  Sleep (Behavioral Health Patients Only)  Calculate sleep? (Click Yes once per 24 hr at 0600 safety check) Yes  Documented sleep last 24 hours 6.5

## 2023-07-24 NOTE — BHH Group Notes (Signed)
 Spirituality Group  Goal: This group is driven by participant needs and themes that emerge from current concerns.  Chaplain offers ground rules and basic framework for engagement, naming the ways that emotions, sense of self and values, grief, and relationship among other concerns all comprise spiritual life.  Theoretical basis: Using the group therapy frameworks of Chyrl Civatte as well as principles in Relational Cultural Therapy as well as Rogerian approaches, participants are invited to explore spiritual needs and to respond in ways that foster mutual empathy and relational support.  Observations: Maddax was an active participant in the group discussion.  Volanda Mangine L. Sophronia Simas, M.Div [phone]

## 2023-07-24 NOTE — Plan of Care (Signed)
   Problem: Education: Goal: Verbalization of understanding the information provided will improve Outcome: Progressing   Problem: Activity: Goal: Interest or engagement in activities will improve Outcome: Not Progressing

## 2023-07-24 NOTE — Progress Notes (Signed)
 [facility] MD Progress Note  07/24/2023 12:37 PM Jack Castaneda  MRN:  [id number] Subjective:   43 year old African-American male, married but separated, part-time employed. Background history of substance use disorder and substance related mood disorder. History of diagnosis of bipolar disorder. Presented voluntarily on account of worsening depression with thoughts of suicide. Intoxicated with alcohol and cocaine.  Chart reviewed today.  Patient discussed at multidisciplinary team meeting.  Nursing staff reports that patient has been appropriate on the unit.  No challenging behavior.  He has been adherent with his medications.  No PRNs required.  BP  remains elevated.  No observed response to internal stimuli.  The patient was seen in his room during rounds this morning.  He denies any new psychiatric complaints or concerns.  He denies suicidal ideation, homicidal ideation, auditory hallucinations, or visual hallucinations.  He denies feeling subjectively depressed.  He does present as somewhat anxious, but states that he does not feel that anxiety is currently troublesome.  He denies side effects from Abilify or naltrexone.  He feels like gabapentin has been helping with cravings.  His blood pressure has been persistently elevated since admission.  We discussed starting amlodipine 2 address the hypertension.  Principal Problem: Bipolar disorder (HCC) Diagnosis: Principal Problem:   Bipolar disorder (HCC) Active Problems:   Polysubstance abuse (HCC)   Alcohol use disorder   Substance induced mood disorder (HCC)   Cannabis abuse, episodic use  Total Time spent with patient: 30 minutes  Past Psychiatric History:  See H&P.  Past Medical History:  Past Medical History:  Diagnosis Date   Bipolar 1 disorder (HCC)    Depression    History reviewed. No pertinent surgical history. Family History: History reviewed. No pertinent family history. Family Psychiatric  History:  See H&P.  Social  History:  Social History   Substance and Sexual Activity  Alcohol Use Yes   Comment: drinks 1/2 pint liquor once per week     Social History   Substance and Sexual Activity  Drug Use Not Currently   Types: Marijuana    Social History   Socioeconomic History   Marital status: Single    Spouse name: Not on file   Number of children: Not on file   Years of education: Not on file   Highest education level: Not on file  Occupational History   Not on file  Tobacco Use   Smoking status: Every Day    Current packs/day: 0.50    Types: Cigarettes   Smokeless tobacco: Never  Vaping Use   Vaping status: Never Used  Substance and Sexual Activity   Alcohol use: Yes    Comment: drinks 1/2 pint liquor once per week   Drug use: Not Currently    Types: Marijuana   Sexual activity: Not on file  Other Topics Concern   Not on file  Social History Narrative   Not on file   Social Drivers of Health   Financial Resource Strain: Not on file  Food Insecurity: No Food Insecurity (07/22/2023)   Hunger Vital Sign    Worried About Running Out of Food in the Last Year: Never true    Ran Out of Food in the Last Year: Never true  Transportation Needs: No Transportation Needs (07/22/2023)   PRAPARE - Administrator, Civil Service (Medical): No    Lack of Transportation (Non-Medical): No  Physical Activity: Not on file  Stress: Not on file  Social Connections: Not on file   Additional  Social History:    Current Medications: Current Facility-Administered Medications  Medication Dose Route Frequency Provider Last Rate Last Admin   acetaminophen (TYLENOL) tablet 650 mg  650 mg Oral Q6H PRN Weber, Kyra A, NP       alum & mag hydroxide-simeth (MAALOX/MYLANTA) 200-200-20 MG/5ML suspension 30 mL  30 mL Oral Q4H PRN Weber, Kyra A, NP       ARIPiprazole (ABILIFY) tablet 10 mg  10 mg Oral Daily Weber, Kyra A, NP   10 mg at 07/24/23 0857   haloperidol (HALDOL) tablet 5 mg  5 mg Oral TID  PRN Phebe Colla A, NP       And   diphenhydrAMINE (BENADRYL) capsule 50 mg  50 mg Oral TID PRN Weber, Kyra A, NP       haloperidol lactate (HALDOL) injection 5 mg  5 mg Intramuscular TID PRN Weber, Bella Kennedy A, NP       And   diphenhydrAMINE (BENADRYL) injection 50 mg  50 mg Intramuscular TID PRN Weber, Bella Kennedy A, NP       And   LORazepam (ATIVAN) injection 2 mg  2 mg Intramuscular TID PRN Weber, Bella Kennedy A, NP       haloperidol lactate (HALDOL) injection 10 mg  10 mg Intramuscular TID PRN Weber, Bella Kennedy A, NP       And   diphenhydrAMINE (BENADRYL) injection 50 mg  50 mg Intramuscular TID PRN Weber, Bella Kennedy A, NP       And   LORazepam (ATIVAN) injection 2 mg  2 mg Intramuscular TID PRN Weber, Bella Kennedy A, NP       folic acid (FOLVITE) tablet 1 mg  1 mg Oral Daily Peterson Ao, MD   1 mg at 07/24/23 0857   gabapentin (NEURONTIN) capsule 300 mg  300 mg Oral TID Peterson Ao, MD   300 mg at 07/24/23 0857   hydrOXYzine (ATARAX) tablet 25 mg  25 mg Oral TID PRN Phebe Colla A, NP   25 mg at 07/23/23 2107   LORazepam (ATIVAN) tablet 1-4 mg  1-4 mg Oral Q1H PRN Peterson Ao, MD       magnesium hydroxide (MILK OF MAGNESIA) suspension 30 mL  30 mL Oral Daily PRN Weber, Kyra A, NP       multivitamin with minerals tablet 1 tablet  1 tablet Oral Daily Peterson Ao, MD   1 tablet at 07/24/23 0857   naltrexone (DEPADE) tablet 50 mg  50 mg Oral Daily Izediuno, Delight Ovens, MD   50 mg at 07/24/23 0857   nicotine polacrilex (NICORETTE) gum 2 mg  2 mg Oral PRN Peterson Ao, MD       ondansetron [facility]) tablet 4 mg  4 mg Oral Q8H PRN Weber, Kyra A, NP       thiamine (Vitamin B-1) tablet 100 mg  100 mg Oral Daily Peterson Ao, MD   100 mg at 07/24/23 8119    Lab Results: No results found for this or any previous visit (from the past 48 hours).  Blood Alcohol level:  Lab Results  Component Value Date   ETH 132 (H) 07/21/2023   ETH 201 (H) 11/26/2022    Metabolic Disorder Labs: Lab Results  Component Value  Date   HGBA1C 5.4 10/29/2022   MPG 108.28 10/29/2022   Lab Results  Component Value Date   PROLACTIN 5.5 10/29/2022   Lab Results  Component Value Date   CHOL 209 (H) 10/29/2022   TRIG 110 10/29/2022   HDL 84  10/29/2022   CHOLHDL 2.5 10/29/2022   VLDL 22 10/29/2022   LDLCALC 103 (H) 10/29/2022    Physical Findings: AIMS:  , ,  ,  ,    CIWA:  CIWA-Ar Total: 0 COWS:     Musculoskeletal: Strength & Muscle Tone: within normal limits Gait & Station: normal Patient leans: N/A  Psychiatric Specialty Exam:  Presentation  General Appearance: Appropriate for environment Eye Contact: Good  Speech: Clear and Coherent; Normal Rate  Speech Volume: Normal  Handedness: Right   Mood and Affect  Mood: Euthymic; Anxious  Affect: Congruent   Thought Process  Thought Processes: Linear  Descriptions of Associations: Intact  Orientation: Full (Time, Place and Person)  Thought Content: Logical  History of Schizophrenia/Schizoaffective disorder: No  Duration of Psychotic Symptoms: NA Hallucinations: Hallucinations: None  Ideas of Reference: None  Suicidal Thoughts: Suicidal Thoughts: No  Homicidal Thoughts: Homicidal Thoughts: No   Sensorium  Memory: Immediate Good  Judgment: Fair  Insight: Fair   Art therapist  Concentration: Good  Attention Span: Good  Recall: Good  Fund of Knowledge: Good  Language: Good   Psychomotor Activity  Psychomotor Activity: Psychomotor Activity: Normal   Assets  Assets: Communication Skills; Desire for Improvement; Social Support   Sleep  Sleep: Sleep: Good    Physical Exam: General: Sitting comfortably. NAD. HEENT: Normocephalic, atraumatic, MMM, EMOI Lungs: no increased work of breathing noted Heart: no cyanosis Abdomen: Non distended Musculoskeletal: FROM. No obvious deformities Skin: Warm, dry, intact. No rashes noted Neuro: No obvious focal deficits.  Gait and station are normal  Review of  Systems  Constitutional: Negative.   HENT: Negative.    Eyes: Negative.   Respiratory: Negative.    Cardiovascular: Negative.   Gastrointestinal: Negative.   Genitourinary: Negative.   Skin: Negative.   Neurological: Negative.   Psychiatric/Behavioral:  Positive for anxiety.     Blood pressure (!) 126/96, pulse 79, temperature 98.3 F (36.8 C), temperature source Oral, resp. rate 16, height 5\' 5"  (1.651 m), weight 62.3 kg, SpO2 100%. Body mass index is 22.86 kg/m.   Treatment Plan Summary: Patient presented with intoxication of alcohol and cocaine.  He is detoxing smoothly.  His blood pressure is trending down.  There are no psychotic symptoms.  No rageful thoughts towards self or others since his being on the unit.  He is doing well on his mood stabilizer.  We will add naltrexone to curb cravings.  We need collateral from his wife.  We will evaluate him further.  1.  Continue aripiprazole 10 mg daily. 2.  Continue gabapentin 300 mg 3 times daily. 3.  Add naltrexone 50 mg daily. 4.  Motivational enhancement. 5.  Encourage unit groups and therapeutic activities. 6.  Continue to monitor mood behavior and interaction with others. 7.  Social worker will gather collateral from his family. 8.  Social worker will coordinate discharge and aftercare planning. 9.  Start amlodipine 5 mg for hypertension   Golda Acre, MD 07/24/2023, 12:37 PM

## 2023-07-25 DIAGNOSIS — F3162 Bipolar disorder, current episode mixed, moderate: Secondary | ICD-10-CM | POA: Diagnosis not present

## 2023-07-25 MED ORDER — ARIPIPRAZOLE 10 MG PO TABS
10.0000 mg | ORAL_TABLET | Freq: Every day | ORAL | 0 refills | Status: AC
Start: 2023-07-26 — End: ?

## 2023-07-25 MED ORDER — AMLODIPINE BESYLATE 5 MG PO TABS: 5.0000 mg | ORAL_TABLET | Freq: Every day | ORAL | 0 refills | Status: AC

## 2023-07-25 MED ORDER — NALTREXONE HCL 50 MG PO TABS: 50.0000 mg | ORAL_TABLET | Freq: Every day | ORAL | 0 refills | Status: AC

## 2023-07-25 NOTE — Plan of Care (Signed)
  Problem: Education: Goal: Knowledge of [facility] Education information/materials will improve Outcome: Completed/Met Goal: Emotional status will improve Outcome: Completed/Met Goal: Mental status will improve Outcome: Completed/Met Goal: Verbalization of understanding the information provided will improve Outcome: Completed/Met   

## 2023-07-25 NOTE — Transportation (Signed)
 07/25/2023  Jack Castaneda DOB: [birth year] MRN: [id number]   RIDER WAIVER AND RELEASE OF LIABILITY  For the purposes of helping with transportation needs, [facility] partners with outside transportation providers (taxi companies, [place], Catering manager.) to give [employer] patients or other approved people the choice of on-demand rides [employer]") to our buildings for non-emergency visits.  By using [employer], I, the person signing this document, on behalf of myself and/or any legal minors (in my care using the [employer]), agree:  Science writer given to me are supplied by independent, outside transportation providers who do not work for, or have any affiliation with, [employer]. [facility] is not a transportation company. [facility] has no control over the quality or safety of the rides I get using [employer]. [facility] has no control over whether any outside ride will happen on time or not. [facility] gives no guarantee on the reliability, quality, safety, or availability on any rides, or that no mistakes will happen. I know and accept that traveling by vehicle (car, truck, [employer], Zenaida Niece, bus, taxi, etc.) has risks of serious injuries such as disability, being paralyzed, and death. I know and agree the risk of using [employer] is mine alone, and not [employer]. Transport Services are provided "as is" and as are available. The transportation providers are in charge for all inspections and care of the vehicles used to provide these rides. I agree not to take legal action against [facility], its agents, employees, officers, directors, representatives, insurers, attorneys, assigns, successors, subsidiaries, and affiliates at any time for any reasons related directly or indirectly to using [employer]. I also agree not to take legal action against [facility] or its affiliates for any injury, death, or damage to property caused by or related to using  [employer]. I have read this Waiver and Release of Liability, and I understand the terms used in it and their legal meaning. This Waiver is freely and voluntarily given with the understanding that my right (or any legal minors) to legal action against [facility] relating to [employer] is knowingly given up to use these services.   I attest that I read the Ride Waiver and Release of Liability to Jack Castaneda, gave Mr. Symanski the opportunity to ask questions and answered the questions asked (if any). I affirm that Jack Castaneda then provided consent for assistance with transportation.

## 2023-07-25 NOTE — Group Note (Signed)
 Date:  07/25/2023 Time:  11:29 AM  Group Topic/Focus:  Goals Group:   The focus of this group is to help patients establish daily goals to achieve during treatment and discuss how the patient can incorporate goal setting into their daily lives to aide in recovery. Orientation:   The focus of this group is to educate the patient on the purpose and policies of crisis stabilization and provide a format to answer questions about their admission.  The group details unit policies and expectations of patients while admitted.    Participation Level:  Did Not Attend  Participation Quality:   n/a  Affect:   n/a  Cognitive:   n/a  Insight: None  Engagement in Group:   n/a  Modes of Intervention:   n/a  Additional Comments:   Did not attend.  Edmund Hilda Travonte Byard 07/25/2023, 11:29 AM

## 2023-07-25 NOTE — Group Note (Unsigned)
 Date:  07/25/2023 Time:  9:49 AM  Group Topic/Focus:  Emotional Education:   The focus of this group is to discuss what feelings/emotions are, and how they are experienced. Goals Group:   The focus of this group is to help patients establish daily goals to achieve during treatment and discuss how the patient can incorporate goal setting into their daily lives to aide in recovery.     Participation Level:  {BHH PARTICIPATION VOJJK:09381}  Participation Quality:  {BHH PARTICIPATION QUALITY:22265}  Affect:  {BHH AFFECT:22266}  Cognitive:  {BHH COGNITIVE:22267}  Insight: {BHH Insight2:20797}  Engagement in Group:  {BHH ENGAGEMENT IN WEXHB:71696}  Modes of Intervention:  {BHH MODES OF INTERVENTION:22269}  Additional Comments:  ***  Gardiner Barefoot 07/25/2023, 9:49 AM
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
## 2023-07-25 NOTE — Discharge Summary (Signed)
 Physician Discharge Summary Note  Patient:  Jack Castaneda is a 43 y.o. male  MRN:  [id number]  DOB:  August 07, 1980  Patient phone: [phone] (home)  Patient address:   [place] [address]   Total Time spent with patient: 36 Minutes  Date of Admission:  07/21/2023  Date of Discharge: 07/25/23   Reason for Admission:    43 year old African-American male, married but separated, part-time employed.  Background history of substance use disorder and substance related mood disorder.  History of diagnosis of bipolar disorder.  Presented voluntarily on account of worsening depression with thoughts of suicide.  Intoxicated with alcohol and cocaine.  Not exhibiting any psychotic symptoms.  Not in acute withdrawals.  We will maintain aripiprazole at 10 mg daily.  Low-dose gabapentin he initiated during this admission.  We will initiate CIWA protocol.  We will gather collateral and evaluate him further.     Jack Castaneda is a 43 y.o., male with a past psychiatric history of bipolar disorder, depression, polysubstance abuse (alcohol, cocaine and THC),induced mood disorder with psychotic symptoms who presents to the [facility] Voluntary from [facility] Emergency Department for evaluation and management of suicidal ideations without a plan.    Intake assessment, patient reports that he got into an argument with his wife and left the house for a walk. While he was out he did some cocaine and drank some alcohol.Patient returned he was fatigued and was making suicidal statements that led him to go to the ED.    Does report some stressors including financial strain, housing instability ( stays w/ wife or friends) and arguments with his wife. Patient reports that he drinks 0.5-1 pint of liquor x1-2 weekly. Also reports cocaine use x1 weekly, "whenever celebrating". She reports that he also does substances whenever he feels overwhelmed with stress.    Patient reports that his wife was  concerned that he would hurt himself and denies suicidal thoughts.  Patient denies depressed mood, issues with sleep, feelings of guilt or hopelessness, issues with concentration, appetite or being self isolative.  He denies current suicidal thoughts, homicidal ideations or auditory or visual hallucinations at this time.  Protective factors include his daughter.    Patient is aware of his past psychiatric history of bipolar order.  Aware of his last manic episode.  He denies symptoms suggestive of hypomania or mania such as elevated mood in the setting of decreased sleep, pressured speech, increased talkativeness, increased agitation or irritability.  Patient does report previous episodes of impulsivity, grandiose thinking and flight of ideas.   Reports past history of hallucinations including seeing shadow walkers and voices over his shoulder. Patient reports that voices are trying to persuade him to do good things and mostly positive.  Patient denies any history of thought broadcasting or paranoia.   Was previously seen in the behavioral health urgent care on October 29, 2022 concern of substance-induced mood disorder with psychosis and was prescribed Zyprexa 10 mg nightly for a 7-day course.  She denies picking up medications " I didn't execute the plan".  He denies being on psychiatric medications outpatient or having any psychiatry follow-up or therapeutic services.    Principal Problem: Bipolar disorder [facility])  Discharge Diagnoses: Principal Problem:   Bipolar disorder (HCC) Active Problems:   Polysubstance abuse (HCC)   Alcohol use disorder   Substance induced mood disorder (HCC)   Cannabis abuse, episodic use     Past Psychiatric (and medical) History: Jack Castaneda  has  a past medical history of Bipolar 1 disorder (HCC) and Depression.  Previous psych diagnoses: Bipolar, schizophrenia, substance-induced mood disorder, substance-induced psychosis, polysubstance, alcohol use disorder,  cannabis use, stimulant use disorder cocaine type Prior inpatient psychiatric treatment:  [facility] in 2015 and short stay at behavioral health urgent care in October 29, 2022 Prior outpatient psychiatric treatment:  Previously with [facility] , denies current psychiatry follow-up Current psychiatric provider: Denies Neuromodulation history: denies   Current therapist: Denies Psychotherapy hx: Denies   History of suicide attempts:  2015 where he attempted to walk out in traffic History of homicide: Denies  Substance Use History: Alcohol: Patient reports that he drinks 0.5-1 pint of liquor x1-2 weekly Hx withdrawal tremors/shakes: denies Hx alcohol related blackouts: denies Hx alcohol induced hallucinations: denies Hx alcoholic seizures: denies Hx medical hospitalization due to severe alcohol withdrawal symptoms: denies DUI: Yes, suspended license ~6962 obacco: Yes, 1 PP week  Cannabis (marijuana): Yes , x1 a week Cocaine: Yes , x1 a week Methamphetamines:  Denies  Psilocybin (mushrooms): Denies Ecstasy (MDMA / molly): Denies LSD (acid): Denies Opiates (fentanyl / heroin): Denies Benzos (Xanax, Klonopin): Denies IV drug use: Denies Prescribed meds abuse: Denies   History of detox: Denies History of rehab: Denies Previously gone to NA and AA meetings   Past Medical History:  Past Medical History:  Diagnosis Date   Bipolar 1 disorder (HCC)    Depression      History reviewed. No pertinent surgical history.   Family History: History reviewed. No pertinent family history.   Family Psychiatric  History: Unaware  Social History:  Social History   Substance and Sexual Activity  Alcohol Use Yes   Comment: drinks 1/2 pint liquor once per week     Social History   Substance and Sexual Activity  Drug Use Not Currently   Types: Marijuana     Social History   Socioeconomic History   Marital status: Single    Spouse name: Not on file   Number of children: Not on file    Years of education: Not on file   Highest education level: Not on file  Occupational History   Not on file  Tobacco Use   Smoking status: Every Day    Current packs/day: 0.50    Types: Cigarettes   Smokeless tobacco: Never  Vaping Use   Vaping status: Never Used  Substance and Sexual Activity   Alcohol use: Yes    Comment: drinks 1/2 pint liquor once per week   Drug use: Not Currently    Types: Marijuana   Sexual activity: Not on file  Other Topics Concern   Not on file  Social History Narrative   Not on file   Social Drivers of Health   Financial Resource Strain: Not on file  Food Insecurity: No Food Insecurity (07/22/2023)   Hunger Vital Sign    Worried About Running Out of Food in the Last Year: Never true    Ran Out of Food in the Last Year: Never true  Transportation Needs: No Transportation Needs (07/22/2023)   PRAPARE - Administrator, Civil Service (Medical): No    Lack of Transportation (Non-Medical): No  Physical Activity: Not on file  Stress: Not on file  Social Connections: Not on file   Place of birth and grew up where: Patient grew up in [place] with his mom, stepdad and 3 other siblings. 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  Psychiatric diagnoses provided upon initial assessment: Polysubstance abuse (HCC) [F19.10]   Patient was started on amlodipine 5 mg for hypertension.  Abilify was titrated to 10 mg for bipolar disorder.  Naltrexone was started for substance abuse and titrated to 50 mg.  He was also started on gabapentin 300 mg 3 times daily to help with withdrawal symptoms; however, this medication was not continued at discharge.  Patient's care was discussed during the interdisciplinary team meeting every day during the hospitalization.  The patient reported some GI side effects which he thought might be attributable to either naltrexone or gabapentin.  Gabapentin was discontinued at discharge, and he was advised to decrease naltrexone to 25 mg daily if GI issues did not resolve within 1 to 2 weeks.  I advised him to follow the advice of his psychiatric provider after discharge regarding these medications.  Gradually, patient started adjusting to milieu. The patient was evaluated each day by a clinical provider to ascertain response to treatment. Improvement was noted by the patient's report of decreasing symptoms, improved sleep and appetite, affect, medication tolerance, behavior, and participation in unit programming.  Patient was asked each day to complete a self inventory noting mood, mental status, pain, new symptoms, anxiety and concerns.    Symptoms were reported as significantly decreased or resolved completely by discharge.   On day of discharge, the patient reports that their mood is stable. The patient denied having suicidal thoughts for more than 48 hours prior to discharge.  Patient denies having homicidal thoughts.  Patient denies having auditory hallucinations.  Patient denies any visual hallucinations or other symptoms of psychosis. The patient was motivated to continue taking medication with a goal of continued improvement in mental health.   The patient reports their target  psychiatric symptoms of psychosis responded well to the psychiatric medications, and the patient reports overall benefit other psychiatric hospitalization. Supportive psychotherapy was provided to the patient. The patient also participated in regular group therapy while hospitalized. Coping skills, problem solving as well as relaxation therapies were also part of the unit programming.  Labs were reviewed with the patient, and abnormal results were discussed with the patient.  The patient is able to verbalize their individual safety plan to this provider.    Physical Findings:  AIMS:  Facial and Oral Movements: None Muscles of Facial Expression: None Lips and Perioral Area: None Jaw: None Tongue: None,Extremity Movements Upper (arms, wrists, hands, fingers): None Lower (legs, knees, ankles, toes): None, Trunk Movements Neck, shoulders, hips: None, Global Judgements Severity of abnormal movements overall: None Incapacitation due to abnormal movements: None Patient's awareness of abnormal movements: No Awareness, Dental Status Current problems with teeth and/or dentures: No Does patient usually wear dentures: No Edentia: No   CIWA:   NA  COWS:  NA  Musculoskeletal: Strength & Muscle Tone: within normal limits Gait & Station: normal Patient leans: N/A    Psychiatric  Specialty Exam:  Presentation  General Appearance: Appropriate for Environment  Eye Contact: Good  Speech: Clear and Coherent; Normal Rate  Speech Volume: Normal  Handedness: Right   Mood and Affect  Mood: Euthymic  Affect: Congruent   Thought Process  Thought Processes: Linear  Descriptions of Associations: Intact  Orientation: Full (Time, Place and Person)  Thought Content: Logical  History of Schizophrenia/Schizoaffective disorder: No  Duration of Psychotic Symptoms: NA Hallucinations: Hallucinations: None  Ideas of Reference: None  Suicidal Thoughts: Suicidal Thoughts:  No  Homicidal Thoughts: Homicidal Thoughts: No   Sensorium  Memory: Immediate Good  Judgment: Fair  Insight: Fair   Art therapist  Concentration: Good  Attention Span: Good  Recall: Good  Fund of Knowledge: Good  Language: Good   Psychomotor Activity  Psychomotor Activity: Psychomotor Activity: Normal   Assets  Assets: Communication Skills; Social Support; Housing   Sleep  Sleep: Sleep: Good Number of Hours of Sleep: 6.5      Physical Exam: General: Sitting comfortably. NAD. HEENT: Normocephalic, atraumatic, MMM, EMOI Lungs: no increased work of breathing noted Heart: no cyanosis Abdomen: Non distended Musculoskeletal: FROM. No obvious deformities Skin: Warm, dry, intact. No rashes noted Neuro: No obvious focal deficits.  Gait and station are normal  Review of Systems:  Constitutional: Negative.   HENT: Negative.    Eyes: Negative.   Respiratory: Negative.    Cardiovascular: Negative.   Gastrointestinal: Negative.   Genitourinary: Negative.   Skin: Negative.   Neurological: Negative.   Psychiatric/Behavioral:  Negative  Blood pressure (!) 133/94, pulse 82, temperature 98.1 F (36.7 C), temperature source Oral, resp. rate 16, height 5\' 5"  (1.651 m), weight 62.3 kg, SpO2 100%. Body mass index is 22.86 kg/m.    Social History   Tobacco Use  Smoking Status Every Day   Current packs/day: 0.50   Types: Cigarettes  Smokeless Tobacco Never     Tobacco Cessation:  A prescription for an FDA approved medication for tobacco cessation was not prescribed because: Patient Refused   Blood Alcohol level:  Lab Results  Component Value Date   ETH 132 (H) 07/21/2023   ETH 201 (H) 11/26/2022    Metabolic Disorder Labs:  Lab Results  Component Value Date   HGBA1C 5.4 10/29/2022   MPG 108.28 10/29/2022   Lab Results  Component Value Date   PROLACTIN 5.5 10/29/2022    Lab Results  Component Value Date   CHOL 209 (H) 10/29/2022    TRIG 110 10/29/2022   HDL 84 10/29/2022   VLDL 22 10/29/2022   LDLCALC 103 (H) 10/29/2022      See Psychiatric Specialty Exam and Suicide Risk Assessment completed by Attending Physician prior to discharge.  Discharge destination: Home  Is patient on multiple antipsychotic therapies at discharge:  No  Has Patient had three or more failed trials of antipsychotic monotherapy by history: NA Recommended Plan for Multiple Antipsychotic Therapies: NA   Discharge Instructions     Diet - low sodium heart healthy   Complete by: As directed    Increase activity slowly   Complete by: As directed         Allergies as of 07/25/2023       Reactions   Banana Nausea Only   Dust Mite Extract Other (See Comments)   Sneezing         Medication List     TAKE these medications      Indication  amLODipine 5 MG tablet Commonly known as: NORVASC Take  1 tablet (5 mg total) by mouth daily. Start taking on: July 26, 2023  Indication: High Blood Pressure   ARIPiprazole 10 MG tablet Commonly known as: ABILIFY Take 1 tablet (10 mg total) by mouth daily. Start taking on: July 26, 2023  Indication: MIXED BIPOLAR AFFECTIVE DISORDER   naltrexone 50 MG tablet Commonly known as: DEPADE Take 1 tablet (50 mg total) by mouth daily. Start taking on: July 26, 2023  Indication: Abuse or Misuse of Alcohol          Follow-up Information     [facility] Go on 08/02/2023.   Specialty: Professional Counselor Why: Please go to this provider for therapy services Monday through Friday, from 9 am to 1 pm for an initial assessment. Contact information: [address] [place] [address] [phone]         [facility]. Go on 08/08/2023.   Specialty: Behavioral Health Why: Please go to this provider for medication management services on Monday through Friday, arrive by 7:00 am for an assessment. Contact information: [phone]  [address] [place] [address] [phone]                   Follow-up recommendations:  - It is recommended to the patient to continue psychiatric medications as prescribed, after discharge from the hospital.   - It is recommended to the patient to follow up with your outpatient psychiatric provider and PCP. - It was discussed with the patient, the impact of alcohol, drugs, tobacco have been there overall psychiatric and medical wellbeing, and total abstinence from substance use was recommended the patient. - Prescriptions provided or sent directly to preferred pharmacy at discharge. Patient agreeable to plan. Given opportunity to ask questions. Appears to feel comfortable with discharge.   - In the event of worsening symptoms, the patient is instructed to call the crisis hotline, 911 and or go to the nearest ED for appropriate evaluation and treatment of symptoms. To follow-up with primary care provider for other medical issues, concerns and or health care needs - Patient was discharged home with a plan to follow up as noted above.   Comments:  NA  Signed: Criss Alvine, MD 07/25/23 11:33 AM

## 2023-07-25 NOTE — Plan of Care (Signed)
  Problem: Education: Goal: Knowledge of [facility] Education information/materials will improve Outcome: Completed/Met Goal: Emotional status will improve Outcome: Completed/Met Goal: Mental status will improve Outcome: Completed/Met Goal: Verbalization of understanding the information provided will improve Outcome: Completed/Met   Problem: Activity: Goal: Interest or engagement in activities will improve Outcome: Completed/Met Goal: Sleeping patterns will improve Outcome: Completed/Met   Problem: Coping: Goal: Ability to verbalize frustrations and anger appropriately will improve Outcome: Completed/Met Goal: Ability to demonstrate self-control will improve Outcome: Completed/Met

## 2023-07-25 NOTE — Progress Notes (Signed)
  [facility] Adult Case Management Discharge Plan :  Will you be returning to the same living situation after discharge:  Yes,  home w/ spouse and child At discharge, do you have transportation home?: Yes,  CSW to arrange [employer] Do you have the ability to pay for your medications: Yes,  patient has recent [employer] benefits, however 7 day free meds have been requested  Release of information consent forms completed and in the chart;  Patient's signature needed at discharge.  Patient to Follow up at:  Follow-up Information     [facility] Go on 08/02/2023.   Specialty: Professional Counselor Why: Please go to this provider for therapy services Monday through Friday, from 9 am to 1 pm for an initial assessment. Contact information: [address] [place] [address] [phone]         [facility]. Go on 08/08/2023.   Specialty: Behavioral Health Why: Please go to this provider for medication management services on Monday through Friday, arrive by 7:00 am for an assessment. Contact information: [address] [place] [address] [phone]                Next level of care provider has access to [facility] Link:yes  Safety Planning and Suicide Prevention discussed: Yes,  completed with spouse Sharen Heck  Has patient been referred to the Quitline?: Patient refused referral for treatment  Patient has been referred for addiction treatment: Yes, referral information given but appointment not made [facility] (list facility).  Joelyn Oms Kida Digiulio, LCSW 07/25/2023, 9:59 AM

## 2023-07-25 NOTE — Progress Notes (Signed)
   07/25/23 0900  Psych Admission Type (Psych Patients Only)  Admission Status Voluntary  Psychosocial Assessment  Patient Complaints None  Eye Contact Fair  Facial Expression Animated  Affect Appropriate to circumstance  Speech Logical/coherent  Interaction Assertive  Motor Activity Slow  Appearance/Hygiene Unremarkable  Behavior Characteristics Cooperative;Appropriate to situation  Mood Pleasant  Thought Process  Coherency WDL  Content WDL  Delusions None reported or observed  Perception WDL  Hallucination None reported or observed  Judgment Limited  Confusion None  Danger to Self  Current suicidal ideation? Denies  Agreement Not to Harm Self Yes  Description of Agreement verbal  Danger to Others  Danger to Others None reported or observed

## 2023-07-25 NOTE — [facility] Suicide Risk Assessment (Signed)
 [facility] INPATIENT:  Family/Significant Other Suicide Prevention Education  Suicide Prevention Education:  Education Completed; Jack Castaneda - wife [phone]),  (name of family member/significant other) has been identified by the patient as the family member/significant other with whom the patient will be residing, and identified as the person(s) who will aid the patient in the event of a mental health crisis (suicidal ideations/suicide attempt).  With written consent from the patient, the family member/significant other has been provided the following suicide prevention education, prior to the and/or following the discharge of the patient.  The suicide prevention education provided includes the following: Suicide risk factors Suicide prevention and interventions National Suicide Hotline telephone number [facility] assessment telephone number [facility] Emergency Assistance 911 [facility] and/or Residential Mobile Crisis Unit telephone number  Request made of family/significant other to: Remove weapons (e.g., guns, rifles, knives), all items previously/currently identified as safety concern.   Remove drugs/medications (over-the-counter, prescriptions, illicit drugs), all items previously/currently identified as a safety concern.  The family member/significant other verbalizes understanding of the suicide prevention education information provided.  The family member/significant other agrees to remove the items of safety concern listed above.  Joelyn Oms Yecheskel Kurek, LCSW 07/25/2023, 9:44 AM

## 2023-07-25 NOTE — Progress Notes (Signed)
 Patient ID: Jack Castaneda, male   DOB: [birth year], 43 y.o.   MRN: [id number] Discharge instruction, medications and follow up appointments reviewed, patient verbalized understanding. Patient discharged home.
